# Patient Record
Sex: Female | Born: 1940 | Race: White | Hispanic: No | Smoking: Former smoker
Health system: Southern US, Community
[De-identification: ages and names within clinical notes are randomized; demographics above are authoritative.]

## PROBLEM LIST (undated history)

## (undated) DIAGNOSIS — J189 Pneumonia, unspecified organism: Secondary | ICD-10-CM

## (undated) DIAGNOSIS — H26499 Other secondary cataract, unspecified eye: Secondary | ICD-10-CM

## (undated) DIAGNOSIS — K219 Gastro-esophageal reflux disease without esophagitis: Secondary | ICD-10-CM

## (undated) DIAGNOSIS — I10 Essential (primary) hypertension: Secondary | ICD-10-CM

## (undated) DIAGNOSIS — M5030 Other cervical disc degeneration, unspecified cervical region: Secondary | ICD-10-CM

## (undated) DIAGNOSIS — Z7982 Long term (current) use of aspirin: Secondary | ICD-10-CM

## (undated) DIAGNOSIS — I779 Disorder of arteries and arterioles, unspecified: Secondary | ICD-10-CM

## (undated) DIAGNOSIS — M5412 Radiculopathy, cervical region: Secondary | ICD-10-CM

## (undated) DIAGNOSIS — E785 Hyperlipidemia, unspecified: Secondary | ICD-10-CM

## (undated) DIAGNOSIS — I6789 Other cerebrovascular disease: Secondary | ICD-10-CM

## (undated) DIAGNOSIS — N6019 Diffuse cystic mastopathy of unspecified breast: Secondary | ICD-10-CM

## (undated) DIAGNOSIS — Z789 Other specified health status: Secondary | ICD-10-CM

## (undated) DIAGNOSIS — M1990 Unspecified osteoarthritis, unspecified site: Secondary | ICD-10-CM

## (undated) DIAGNOSIS — D75839 Thrombocytosis, unspecified: Secondary | ICD-10-CM

## (undated) DIAGNOSIS — E538 Deficiency of other specified B group vitamins: Secondary | ICD-10-CM

## (undated) DIAGNOSIS — F419 Anxiety disorder, unspecified: Secondary | ICD-10-CM

## (undated) DIAGNOSIS — H8110 Benign paroxysmal vertigo, unspecified ear: Secondary | ICD-10-CM

## (undated) DIAGNOSIS — L570 Actinic keratosis: Secondary | ICD-10-CM

## (undated) DIAGNOSIS — K449 Diaphragmatic hernia without obstruction or gangrene: Secondary | ICD-10-CM

## (undated) HISTORY — DX: Actinic keratosis: L57.0

## (undated) HISTORY — PX: ABDOMINAL HYSTERECTOMY: SHX81

## (undated) HISTORY — PX: CORONARY ARTERY BYPASS GRAFT: SHX141

## (undated) HISTORY — PX: BREAST BIOPSY: SHX20

---

## 1997-10-01 ENCOUNTER — Other Ambulatory Visit: Admission: RE | Admit: 1997-10-01 | Discharge: 1997-10-01 | Payer: Self-pay | Admitting: *Deleted

## 1997-10-01 ENCOUNTER — Ambulatory Visit (HOSPITAL_COMMUNITY): Admission: RE | Admit: 1997-10-01 | Discharge: 1997-10-01 | Payer: Self-pay | Admitting: *Deleted

## 1997-10-14 ENCOUNTER — Ambulatory Visit (HOSPITAL_COMMUNITY): Admission: RE | Admit: 1997-10-14 | Discharge: 1997-10-14 | Payer: Self-pay | Admitting: *Deleted

## 1997-11-27 ENCOUNTER — Ambulatory Visit (HOSPITAL_BASED_OUTPATIENT_CLINIC_OR_DEPARTMENT_OTHER): Admission: RE | Admit: 1997-11-27 | Discharge: 1997-11-27 | Payer: Self-pay | Admitting: General Surgery

## 1998-10-26 ENCOUNTER — Other Ambulatory Visit: Admission: RE | Admit: 1998-10-26 | Discharge: 1998-10-26 | Payer: Self-pay | Admitting: *Deleted

## 1998-10-26 ENCOUNTER — Encounter: Payer: Self-pay | Admitting: *Deleted

## 1998-10-26 ENCOUNTER — Ambulatory Visit (HOSPITAL_COMMUNITY): Admission: RE | Admit: 1998-10-26 | Discharge: 1998-10-26 | Payer: Self-pay | Admitting: *Deleted

## 1999-11-02 ENCOUNTER — Ambulatory Visit (HOSPITAL_COMMUNITY): Admission: RE | Admit: 1999-11-02 | Discharge: 1999-11-02 | Payer: Self-pay | Admitting: *Deleted

## 1999-11-02 ENCOUNTER — Other Ambulatory Visit: Admission: RE | Admit: 1999-11-02 | Discharge: 1999-11-02 | Payer: Self-pay | Admitting: *Deleted

## 1999-11-02 ENCOUNTER — Encounter: Payer: Self-pay | Admitting: *Deleted

## 2000-11-05 ENCOUNTER — Other Ambulatory Visit: Admission: RE | Admit: 2000-11-05 | Discharge: 2000-11-05 | Payer: Self-pay | Admitting: *Deleted

## 2000-11-05 ENCOUNTER — Encounter: Payer: Self-pay | Admitting: *Deleted

## 2000-11-05 ENCOUNTER — Ambulatory Visit (HOSPITAL_COMMUNITY): Admission: RE | Admit: 2000-11-05 | Discharge: 2000-11-05 | Payer: Self-pay | Admitting: *Deleted

## 2001-11-26 ENCOUNTER — Encounter: Payer: Self-pay | Admitting: *Deleted

## 2001-11-26 ENCOUNTER — Ambulatory Visit (HOSPITAL_COMMUNITY): Admission: RE | Admit: 2001-11-26 | Discharge: 2001-11-26 | Payer: Self-pay | Admitting: *Deleted

## 2001-11-26 ENCOUNTER — Other Ambulatory Visit: Admission: RE | Admit: 2001-11-26 | Discharge: 2001-11-26 | Payer: Self-pay | Admitting: *Deleted

## 2004-12-13 ENCOUNTER — Ambulatory Visit: Payer: Self-pay | Admitting: Internal Medicine

## 2005-11-28 ENCOUNTER — Other Ambulatory Visit: Payer: Self-pay

## 2005-11-28 ENCOUNTER — Emergency Department: Payer: Self-pay | Admitting: Emergency Medicine

## 2006-01-24 ENCOUNTER — Ambulatory Visit: Payer: Self-pay | Admitting: Internal Medicine

## 2006-12-19 ENCOUNTER — Ambulatory Visit: Payer: Self-pay | Admitting: Internal Medicine

## 2007-01-15 ENCOUNTER — Other Ambulatory Visit: Payer: Self-pay

## 2007-01-15 ENCOUNTER — Ambulatory Visit: Payer: Self-pay | Admitting: General Surgery

## 2007-01-18 ENCOUNTER — Ambulatory Visit: Payer: Self-pay | Admitting: General Surgery

## 2007-02-06 ENCOUNTER — Ambulatory Visit: Payer: Self-pay | Admitting: Internal Medicine

## 2007-06-21 ENCOUNTER — Ambulatory Visit: Payer: Self-pay | Admitting: Internal Medicine

## 2007-06-21 DIAGNOSIS — I2510 Atherosclerotic heart disease of native coronary artery without angina pectoris: Secondary | ICD-10-CM

## 2007-06-21 HISTORY — PX: LEFT HEART CATH AND CORONARY ANGIOGRAPHY: CATH118249

## 2007-06-21 HISTORY — DX: Atherosclerotic heart disease of native coronary artery without angina pectoris: I25.10

## 2007-07-10 DIAGNOSIS — Z951 Presence of aortocoronary bypass graft: Secondary | ICD-10-CM

## 2007-07-10 HISTORY — DX: Presence of aortocoronary bypass graft: Z95.1

## 2007-07-10 HISTORY — PX: CORONARY ARTERY BYPASS GRAFT: SHX141

## 2007-08-21 ENCOUNTER — Encounter: Payer: Self-pay | Admitting: Internal Medicine

## 2007-09-07 ENCOUNTER — Encounter: Payer: Self-pay | Admitting: Internal Medicine

## 2007-10-08 ENCOUNTER — Encounter: Payer: Self-pay | Admitting: Internal Medicine

## 2007-11-07 ENCOUNTER — Encounter: Payer: Self-pay | Admitting: Internal Medicine

## 2008-02-11 ENCOUNTER — Ambulatory Visit: Payer: Self-pay | Admitting: Internal Medicine

## 2009-02-12 ENCOUNTER — Ambulatory Visit: Payer: Self-pay | Admitting: Internal Medicine

## 2010-01-05 ENCOUNTER — Ambulatory Visit: Payer: Self-pay | Admitting: Otolaryngology

## 2010-02-14 ENCOUNTER — Ambulatory Visit: Payer: Self-pay | Admitting: Internal Medicine

## 2011-02-16 ENCOUNTER — Ambulatory Visit: Payer: Self-pay | Admitting: Internal Medicine

## 2011-04-05 ENCOUNTER — Ambulatory Visit: Payer: Self-pay | Admitting: Internal Medicine

## 2012-02-19 ENCOUNTER — Ambulatory Visit: Payer: Self-pay | Admitting: Internal Medicine

## 2013-02-19 ENCOUNTER — Ambulatory Visit: Payer: Self-pay | Admitting: Internal Medicine

## 2013-02-24 ENCOUNTER — Ambulatory Visit: Payer: Self-pay | Admitting: Internal Medicine

## 2013-07-24 ENCOUNTER — Ambulatory Visit: Payer: Self-pay | Admitting: Internal Medicine

## 2014-03-02 ENCOUNTER — Ambulatory Visit: Payer: Self-pay | Admitting: Internal Medicine

## 2014-10-19 DIAGNOSIS — Z85828 Personal history of other malignant neoplasm of skin: Secondary | ICD-10-CM

## 2014-10-19 HISTORY — DX: Personal history of other malignant neoplasm of skin: Z85.828

## 2015-02-16 ENCOUNTER — Other Ambulatory Visit: Payer: Self-pay | Admitting: Internal Medicine

## 2015-02-16 DIAGNOSIS — Z1231 Encounter for screening mammogram for malignant neoplasm of breast: Secondary | ICD-10-CM

## 2015-03-05 ENCOUNTER — Other Ambulatory Visit: Payer: Self-pay | Admitting: Internal Medicine

## 2015-03-05 ENCOUNTER — Ambulatory Visit
Admission: RE | Admit: 2015-03-05 | Discharge: 2015-03-05 | Disposition: A | Discharge status: Home / Self Care | Payer: Medicare Other | Source: Ambulatory Visit | Attending: Internal Medicine | Admitting: Internal Medicine

## 2015-03-05 DIAGNOSIS — Z1231 Encounter for screening mammogram for malignant neoplasm of breast: Secondary | ICD-10-CM | POA: Insufficient documentation

## 2015-08-18 ENCOUNTER — Other Ambulatory Visit: Payer: Self-pay | Admitting: Internal Medicine

## 2015-08-18 DIAGNOSIS — R1310 Dysphagia, unspecified: Secondary | ICD-10-CM

## 2015-08-30 ENCOUNTER — Ambulatory Visit
Admission: RE | Admit: 2015-08-30 | Discharge: 2015-08-30 | Disposition: A | Discharge status: Home / Self Care | Payer: Medicare Other | Source: Ambulatory Visit | Attending: Internal Medicine | Admitting: Internal Medicine

## 2015-08-30 DIAGNOSIS — K219 Gastro-esophageal reflux disease without esophagitis: Secondary | ICD-10-CM | POA: Diagnosis not present

## 2015-08-30 DIAGNOSIS — R1310 Dysphagia, unspecified: Secondary | ICD-10-CM | POA: Diagnosis not present

## 2016-02-17 ENCOUNTER — Other Ambulatory Visit: Payer: Self-pay | Admitting: Internal Medicine

## 2016-02-17 DIAGNOSIS — Z1231 Encounter for screening mammogram for malignant neoplasm of breast: Secondary | ICD-10-CM

## 2016-03-17 ENCOUNTER — Ambulatory Visit
Admission: RE | Admit: 2016-03-17 | Discharge: 2016-03-17 | Disposition: A | Discharge status: Home / Self Care | Payer: Medicare Other | Source: Ambulatory Visit | Attending: Internal Medicine | Admitting: Internal Medicine

## 2016-03-17 DIAGNOSIS — Z1231 Encounter for screening mammogram for malignant neoplasm of breast: Secondary | ICD-10-CM | POA: Diagnosis not present

## 2016-12-27 ENCOUNTER — Encounter: Payer: Self-pay | Admitting: Emergency Medicine

## 2016-12-27 ENCOUNTER — Emergency Department: Payer: Medicare Other

## 2016-12-27 ENCOUNTER — Emergency Department
Admission: EM | Admit: 2016-12-27 | Discharge: 2016-12-27 | Disposition: A | Discharge status: Home / Self Care | Payer: Medicare Other | Attending: Emergency Medicine | Admitting: Emergency Medicine

## 2016-12-27 DIAGNOSIS — Z951 Presence of aortocoronary bypass graft: Secondary | ICD-10-CM | POA: Diagnosis not present

## 2016-12-27 DIAGNOSIS — J705 Respiratory conditions due to smoke inhalation: Secondary | ICD-10-CM

## 2016-12-27 DIAGNOSIS — T59811A Toxic effect of smoke, accidental (unintentional), initial encounter: Secondary | ICD-10-CM | POA: Insufficient documentation

## 2016-12-27 DIAGNOSIS — Z87891 Personal history of nicotine dependence: Secondary | ICD-10-CM | POA: Diagnosis not present

## 2016-12-27 DIAGNOSIS — R0602 Shortness of breath: Secondary | ICD-10-CM | POA: Diagnosis present

## 2016-12-27 HISTORY — DX: Hyperlipidemia, unspecified: E78.5

## 2016-12-27 MED ORDER — IPRATROPIUM-ALBUTEROL 0.5-2.5 (3) MG/3ML IN SOLN
3.0000 mL | Freq: Once | RESPIRATORY_TRACT | Status: AC
  Administered 2016-12-27: 3 mL via RESPIRATORY_TRACT
  Filled 2016-12-27: qty 3

## 2016-12-27 MED ORDER — DEXAMETHASONE SODIUM PHOSPHATE 10 MG/ML IJ SOLN
10.0000 mg | Freq: Once | INTRAMUSCULAR | Status: AC
  Administered 2016-12-27: 10 mg via INTRAVENOUS
  Filled 2016-12-27: qty 1

## 2016-12-27 MED ORDER — ALBUTEROL SULFATE HFA 108 (90 BASE) MCG/ACT IN AERS: 2.0000 | INHALATION_SPRAY | Freq: Four times a day (QID) | RESPIRATORY_TRACT | 0 refills | Status: DC | PRN
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
## 2016-12-27 NOTE — ED Notes (Signed)
Pt states she is breathing easier after completing a breathing treatment. No distress noted. Family at bedside.

## 2016-12-27 NOTE — ED Notes (Signed)
Pt returned from xray via stretcher.

## 2016-12-27 NOTE — Discharge Instructions (Signed)
Please use your inhaler as needed for cough and shortness of breath and follow-up with your primary care physician this coming Monday for recheck.  Return to the emergency department sooner for any concerns whatsoever.  It was a pleasure to take care of you today, and thank you for coming to our emergency department.  If you have any questions or concerns before leaving please ask the nurse to grab me and I'm more than happy to go through your aftercare instructions again.  If you were prescribed any opioid pain medication today such as Norco, Vicodin, Percocet, morphine, hydrocodone, or oxycodone please make sure you do not drive when you are taking this medication as it can alter your ability to drive safely.  If you have any concerns once you are home that you are not improving or are in fact getting worse before you can make it to your follow-up appointment, please do not hesitate to call 911 and come back for further evaluation.  Darel Hong, MD  Results for orders placed or performed during the hospital encounter of 12/27/16  Blood gas, venous  Result Value Ref Range   pH, Ven 7.38 7.250 - 7.430   pCO2, Ven 61 (H) 44.0 - 60.0 mmHg   pO2, Ven PENDING 32.0 - 45.0 mmHg   Bicarbonate 36.1 (H) 20.0 - 28.0 mmol/L   Acid-Base Excess 8.4 (H) 0.0 - 2.0 mmol/L   O2 Saturation PENDING %   Patient temperature 37.0    Collection site VENOUS    Sample type VENOUS    Dg Chest 2 View  Result Date: 12/27/2016 CLINICAL DATA:  Shortness of breath.  Ex-smoker EXAM: CHEST  2 VIEW COMPARISON:  11/28/2005 FINDINGS: Hyperinflation. Prior median sternotomy. Midline trachea. Normal heart size. No pleural effusion or pneumothorax. Moderate lower lobe predominant interstitial thickening. No lobar consolidation. IMPRESSION: Hyperinflation and interstitial thickening, consistent with the clinical history of prior smoking. No acute superimposed process. Electronically Signed   By: Abigail Miyamoto M.D.   On:  12/27/2016 19:18

## 2016-12-27 NOTE — ED Notes (Signed)
Pt taken to xray via stretcher  
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
## 2016-12-30 LAB — BLOOD GAS, VENOUS
Acid-Base Excess: 8.4 mmol/L — ABNORMAL HIGH (ref 0.0–2.0)
Bicarbonate: 36.1 mmol/L — ABNORMAL HIGH (ref 20.0–28.0)
PCO2 VEN: 61 mmHg — AB (ref 44.0–60.0)
PH VEN: 7.38 (ref 7.250–7.430)
Patient temperature: 37

## 2017-02-21 ENCOUNTER — Other Ambulatory Visit: Payer: Self-pay | Admitting: Internal Medicine

## 2017-02-21 DIAGNOSIS — Z1231 Encounter for screening mammogram for malignant neoplasm of breast: Secondary | ICD-10-CM

## 2017-04-17 ENCOUNTER — Ambulatory Visit
Admission: RE | Admit: 2017-04-17 | Discharge: 2017-04-17 | Disposition: A | Discharge status: Home / Self Care | Payer: Medicare Other | Source: Ambulatory Visit | Attending: Internal Medicine | Admitting: Internal Medicine

## 2017-04-17 DIAGNOSIS — Z1231 Encounter for screening mammogram for malignant neoplasm of breast: Secondary | ICD-10-CM | POA: Diagnosis present

## 2017-08-20 ENCOUNTER — Other Ambulatory Visit: Payer: Self-pay | Admitting: Internal Medicine

## 2017-08-20 DIAGNOSIS — M5442 Lumbago with sciatica, left side: Secondary | ICD-10-CM

## 2017-09-03 ENCOUNTER — Ambulatory Visit
Admission: RE | Admit: 2017-09-03 | Discharge: 2017-09-03 | Disposition: A | Discharge status: Home / Self Care | Payer: Medicare Other | Source: Ambulatory Visit | Attending: Internal Medicine | Admitting: Internal Medicine

## 2017-09-03 DIAGNOSIS — M5136 Other intervertebral disc degeneration, lumbar region: Secondary | ICD-10-CM | POA: Diagnosis not present

## 2017-09-03 DIAGNOSIS — M48061 Spinal stenosis, lumbar region without neurogenic claudication: Secondary | ICD-10-CM | POA: Insufficient documentation

## 2017-09-03 DIAGNOSIS — M5442 Lumbago with sciatica, left side: Secondary | ICD-10-CM

## 2017-10-04 ENCOUNTER — Ambulatory Visit: Payer: Medicare Other | Attending: Family Medicine

## 2017-10-04 VITALS — BP 154/80 | HR 80

## 2017-10-04 DIAGNOSIS — G8929 Other chronic pain: Secondary | ICD-10-CM | POA: Diagnosis present

## 2017-10-04 DIAGNOSIS — M5442 Lumbago with sciatica, left side: Secondary | ICD-10-CM | POA: Insufficient documentation

## 2017-10-04 NOTE — Therapy (Signed)
[place] MAIN [facility] SERVICES [address] [place], [place], [address] Phone: [phone]   Fax:  [phone]  Physical Therapy Evaluation  Patient Details  Name: Caitlin Roy MRN: [id number] Date of Birth: 03-21-1940 Referring Provider: Loree Fee Meeler   Encounter Date: 10/04/2017  PT End of Session - 10/05/17 1255    Visit Number  1    Number of Visits  13    Date for PT Re-Evaluation  11/15/17    Authorization Type  Progress note: 1/10    Authorization Time Period  Last goals: 10/04/17    PT Start Time  1300    PT Stop Time  1405    PT Time Calculation (min)  65 min    Activity Tolerance  Patient tolerated treatment well    Behavior During Therapy  [facility] for tasks assessed/performed       Past Medical History:  Diagnosis Date  . Hyperlipidemia     Past Surgical History:  Procedure Laterality Date  . ABDOMINAL HYSTERECTOMY    . BREAST BIOPSY Bilateral 10+ years ago   Negative  . CORONARY ARTERY BYPASS GRAFT      Vitals:   10/04/17 1308  BP: (!) 154/80  Pulse: 80  SpO2: 94%     Subjective Assessment - 10/05/17 1247    Subjective  Low back pain    Pertinent History  Pt was referred for acute on chronic left low back pain with radiation to left buttock and down the LLE to the level of the calf. Pt reports that her leg felt "tight" like she couldn't bend it and she was having numbness in her toes. She denies any trauma. Worst pain is 5/10, best is 0/10 and current pain is 3/10. Pain worsens as the day goes on. She plans to follow-up with her MD in 3 months. She was treated with 2 prednisone tapers and the second bout improved her symptoms in the lower left leg. She states that she still has some "crawling feelings" in her L lateral calf.  Her pain originally began approximately 2 years ago (2017) without injury. She was evaluated by neurosurgery on 09/20/2017 at which time conservative management was recommended.     Limitations   Standing;Walking;Sitting    Diagnostic tests  MRI lumbar spine without contrast from [facility]: L5-S1 there is severe bilateral facet arthropathy with shallow disc bulge. Severe left-sided foraminal stenosis moderate right-sided foraminal stenosis. L4-5 severe bilateral facet arthropathy with shallow disc bulge. Borderline central stenosis. Moderate left and mild right foraminal stenosis. L3-4 shallow disc bulge with mild bilateral facet arthropathy. No central or foraminal stenosis. L2-3 left sided disc bulge without central or foraminal stenosis. L1-2 shallow disc bulge without central or foraminal stenosis.    Patient Stated Goals  Decrease her back pain    Currently in Pain?  Yes    Pain Score  3     Pain Location  Back    Pain Orientation  Left;Lower    Pain Descriptors / Indicators  Aching    Pain Type  Chronic pain    Pain Onset  More than a month ago    Pain Frequency  Intermittent    Aggravating Factors   Extended standing, extended walking, extended sitting especially on a hard chair    Pain Relieving Factors  Prednisone taper, Tylenol, Aleve (however causes GI upset), lay down on her side (less pain when laying on left side)    Multiple Pain Sites  No  Rehab Potential  Fair    PT Frequency  2x / week    PT Duration  6 weeks    PT Treatment/Interventions  Cryotherapy;Aquatic Therapy;Electrical Stimulation;Canalith Repostioning;Iontophoresis 4mg /ml Dexamethasone;Moist Heat;Traction;Ultrasound;DME Instruction;Gait training;Stair training;Functional mobility training;Therapeutic activities;Therapeutic exercise;Balance training;Neuromuscular re-education;Patient/family education;Manual techniques;Passive range of motion;Dry needling;Vestibular    PT Next Visit Plan  Review and progress HEP to include strengthening as appropriate,  strengthening and manual techniques    PT Home Exercise Plan  L HS stretch, L piriformis stretch    Consulted and Agree with Plan of Care  Patient       Patient will benefit from skilled therapeutic intervention in order to improve the following deficits and impairments:  Decreased range of motion, Decreased strength, Pain  Visit Diagnosis: Chronic left-sided low back pain with left-sided sciatica     Problem List There are no active problems to display for this patient.  Phillips Grout PT, DPT, GCS  [name] 10/05/2017, 1:25 PM  [place] [facility] SERVICES [address] [place], [place], [address] Phone: [phone]   Fax:  [phone]  Name: Caitlin Roy MRN: [id number] Date of Birth: 1941-02-04  Rehab Potential  Fair    PT Frequency  2x / week    PT Duration  6 weeks    PT Treatment/Interventions  Cryotherapy;Aquatic Therapy;Electrical Stimulation;Canalith Repostioning;Iontophoresis 4mg /ml Dexamethasone;Moist Heat;Traction;Ultrasound;DME Instruction;Gait training;Stair training;Functional mobility training;Therapeutic activities;Therapeutic exercise;Balance training;Neuromuscular re-education;Patient/family education;Manual techniques;Passive range of motion;Dry needling;Vestibular    PT Next Visit Plan  Review and progress HEP to include strengthening as appropriate,  strengthening and manual techniques    PT Home Exercise Plan  L HS stretch, L piriformis stretch    Consulted and Agree with Plan of Care  Patient       Patient will benefit from skilled therapeutic intervention in order to improve the following deficits and impairments:  Decreased range of motion, Decreased strength, Pain  Visit Diagnosis: Chronic left-sided low back pain with left-sided sciatica     Problem List There are no active problems to display for this patient.  Phillips Grout PT, DPT, GCS  [name] 10/05/2017, 1:25 PM  [place] [facility] SERVICES [address] [place], [place], [address] Phone: [phone]   Fax:  [phone]  Name: Caitlin Roy MRN: [id number] Date of Birth: 1941-02-04  [place] MA[facility] SERVICES [address] [place], [place], [address] Phone: [phone]   Fax:  [phone]  Physical Therapy Evaluation  Patient Details  Name: Caitlin Roy MRN: [id number] Date of Birth: 03-21-1940 Referring Provider: Loree Fee Meeler   Encounter Date: 10/04/2017  PT End of Session - 10/05/17 1255    Visit Number  1    Number of Visits  13    Date for PT Re-Evaluation  11/15/17    Authorization Type  Progress note: 1/10    Authorization Time Period  Last goals: 10/04/17    PT Start Time  1300    PT Stop Time  1405    PT Time Calculation (min)  65 min    Activity Tolerance  Patient tolerated treatment well    Behavior During Therapy  [facility] for tasks assessed/performed       Past Medical History:  Diagnosis Date  . Hyperlipidemia     Past Surgical History:  Procedure Laterality Date  . ABDOMINAL HYSTERECTOMY    . BREAST BIOPSY Bilateral 10+ years ago   Negative  . CORONARY ARTERY BYPASS GRAFT      Vitals:   10/04/17 1308  BP: (!) 154/80  Pulse: 80  SpO2: 94%     Subjective Assessment - 10/05/17 1247    Subjective  Low back pain    Pertinent History  Pt was referred for acute on chronic left low back pain with radiation to left buttock and down the LLE to the level of the calf. Pt reports that her leg felt "tight" like she couldn't bend it and she was having numbness in her toes. She denies any trauma. Worst pain is 5/10, best is 0/10 and current pain is 3/10. Pain worsens as the day goes on. She plans to follow-up with her MD in 3 months. She was treated with 2 prednisone tapers and the second bout improved her symptoms in the lower left leg. She states that she still has some "crawling feelings" in her L lateral calf.  Her pain originally began approximately 2 years ago (2017) without injury. She was evaluated by neurosurgery on 09/20/2017 at which time conservative management was recommended.     Limitations   Standing;Walking;Sitting    Diagnostic tests  MRI lumbar spine without contrast from [facility]: L5-S1 there is severe bilateral facet arthropathy with shallow disc bulge. Severe left-sided foraminal stenosis moderate right-sided foraminal stenosis. L4-5 severe bilateral facet arthropathy with shallow disc bulge. Borderline central stenosis. Moderate left and mild right foraminal stenosis. L3-4 shallow disc bulge with mild bilateral facet arthropathy. No central or foraminal stenosis. L2-3 left sided disc bulge without central or foraminal stenosis. L1-2 shallow disc bulge without central or foraminal stenosis.    Patient Stated Goals  Decrease her back pain    Currently in Pain?  Yes    Pain Score  3     Pain Location  Back    Pain Orientation  Left;Lower    Pain Descriptors / Indicators  Aching    Pain Type  Chronic pain    Pain Onset  More than a month ago    Pain Frequency  Intermittent    Aggravating Factors   Extended standing, extended walking, extended sitting especially on a hard chair    Pain Relieving Factors  Prednisone taper, Tylenol, Aleve (however causes GI upset), lay down on her side (less pain when laying on left side)    Multiple Pain Sites  No

## 2017-10-09 ENCOUNTER — Ambulatory Visit: Payer: Medicare Other | Attending: Family Medicine | Admitting: Physical Therapy

## 2017-10-09 DIAGNOSIS — G8929 Other chronic pain: Secondary | ICD-10-CM | POA: Diagnosis present

## 2017-10-09 DIAGNOSIS — M5442 Lumbago with sciatica, left side: Secondary | ICD-10-CM | POA: Insufficient documentation

## 2017-10-09 NOTE — Therapy (Signed)
[place] MAIN [facility] SERVICES [place], [place], [address] Phone: [phone]   Fax:  [phone]  Physical Therapy Treatment  Patient Details  Name: Caitlin Roy MRN: [id number] Date of Birth: 12-23-1940 Referring Provider: Loree Fee Meeler   Encounter Date: 10/09/2017  PT End of Session - 10/09/17 1414    Visit Number  2    Number of Visits  13    Date for PT Re-Evaluation  11/15/17    Authorization Type  Progress note: 1/10    Authorization Time Period  Last goals: 10/04/17       Past Medical History:  Diagnosis Date  . Hyperlipidemia     Past Surgical History:  Procedure Laterality Date  . ABDOMINAL HYSTERECTOMY    . BREAST BIOPSY Bilateral 10+ years ago   Negative  . CORONARY ARTERY BYPASS GRAFT      There were no vitals filed for this visit.  Subjective Assessment - 10/09/17 1354    Subjective  Pt reporting she has been trying out her HEP consistently, no complaints, she feels they are crystal clear, but does endorse some dislike of the neural flossing activity.     Pertinent History  Pt was referred for acute on chronic left low back pain with radiation to left buttock and down the LLE to the level of the calf. Pt reports that her leg felt "tight" like she couldn't bend it and she was having numbness in her toes. She denies any trauma. Worst pain is 5/10, best is 0/10 and current pain is 3/10. Pain worsens as the day goes on. She plans to follow-up with her MD in 3 months. She was treated with 2 prednisone tapers and the second bout improved her symptoms in the lower left leg. She states that she still has some "crawling feelings" in her L lateral calf.  Her pain originally began approximately 2 years ago (2017) without injury. She was evaluated by neurosurgery on 09/20/2017 at which time conservative management was recommended.     Limitations  Standing;Walking;Sitting    Currently in Pain?  Yes    Pain Score  7     Pain  Location  --   Left PSIS/superior SIJ.   Pain Orientation  Left       Treatment this session:  -Myofascial release to posterio hip 5x30sec at various portions of glute max and glute med, does not abate symptoms.  -Left hip extension mobilization with movement in prone: 3x30sec -Left hip external rotation + Extension mobilization with movement in slight abduction (prone): 3x30sec -Left hip External rotation stretch P/ROM in prone 2x30sec -Left hip long axis distraction supine: 1x2 minutes -glute max bridge 2x10 (end range knee flexion c wide stance/knees) -DKTC stretch 6x15 seconds (mild reduction of symptoms in flexion, wide to avoid hip impingement)  -Reverse Curlup, 2x10 **heavy verbal and tactile cues to achieve correct posture and form for exercises.          PT Short Term Goals - 10/05/17 1308      PT SHORT TERM GOAL #1   Title  Pt will be independent with HEP in order to improve strength and decrease pain in order to improve pain-free function at home and work.     Time  3    Period  Weeks    Status  New    Target Date  10/25/17        PT Long Term Goals - 10/05/17 1308  PT LONG TERM GOAL #1   Title  Pt will decrease worst pain as reported on NPRS by at least 3 points in order to demonstrate clinically significant reduction in pain.    Baseline  10/04/17: 5/10    Time  6    Period  Weeks    Status  New    Target Date  11/15/17      PT LONG TERM GOAL #2   Title  Pt will increase strength of L hip and L knee by at least 1/2 MMT grade in order to demonstrate improvement in strength and function     Baseline  10/04/17: 4/5 hip extension, 4/5 knee flexion    Time  6    Period  Weeks    Status  New    Target Date  11/15/17      PT LONG TERM GOAL #3   Title  Pt will improve L hip IR ROM by at least 5 degrees to decrease lumbar forces when turning and decrease pain    Baseline  10/04/17: 30 degrees    Time  6    Period  Weeks    Status  New    Target Date   11/15/17            Plan - 10/09/17 1414    Clinical Impression Statement  Reviewed goals and HEP. Continued to explore specific activities that modify pain. Pt does endorse some pain primarily at the left SIJ area, easy to palpate superfiscially, and clearly with some superfiscial soft tissue componenets, however provocation testing of the SIJ does not affect pain. Pt does endorse some decreased pain with DKTC stretch positions. Sustained release not decrease pain suggestive of low liklihood of primary symptomatic complaint being myofascial in origin. Sustained lumbar lordosis does not appear to affect symptoms to any acute degree. Mobilization of th ehip joint and deep palpation of th eposterio hip joint aggravate pain as well as refering pain into the anterior proximal leg as well. No noted reproduction/exacerbation of pain with pelvic compression test or sacral thrust.     PT Frequency  2x / week    PT Duration  6 weeks    PT Treatment/Interventions  Cryotherapy;Aquatic Therapy;Electrical Stimulation;Canalith Repostioning;Iontophoresis 4mg /ml Dexamethasone;Moist Heat;Traction;Ultrasound;DME Instruction;Gait training;Stair training;Functional mobility training;Therapeutic activities;Therapeutic exercise;Balance training;Neuromuscular re-education;Patient/family education;Manual techniques;Passive range of motion;Dry needling;Vestibular    PT Next Visit Plan  Progress HEP as approprriate to include strengthening, strengthening; consider additional testing to assess involvement of Left SIJ     PT Home Exercise Plan  L HS stretch, L piriformis stretch    Consulted and Agree with Plan of Care  Patient       Patient will benefit from skilled therapeutic intervention in order to improve the following deficits and impairments:     Visit Diagnosis: Chronic left-sided low back pain with left-sided sciatica     Problem List There are no active problems to display for this patient.  2:38 PM,  10/09/17 Etta Grandchild, PT, DPT Physical Therapist - [facility]  Outpatient Physical Therapy- [place] [phone]    Etta Grandchild 10/09/2017, 2:37 PM  [place] MAIN [facility] SERVICES [address] [place], [place], [address] Phone: [phone]   Fax:  [phone]  Name: Caitlin Roy MRN: [id number] Date of Birth: 04/14/1940

## 2017-10-11 ENCOUNTER — Encounter: Payer: Medicare Other | Admitting: Physical Therapy

## 2017-10-16 ENCOUNTER — Other Ambulatory Visit: Payer: Self-pay

## 2017-10-16 ENCOUNTER — Encounter: Payer: Self-pay | Admitting: Physical Therapy

## 2017-10-16 ENCOUNTER — Ambulatory Visit: Payer: Medicare Other | Admitting: Physical Therapy

## 2017-10-16 DIAGNOSIS — G8929 Other chronic pain: Secondary | ICD-10-CM

## 2017-10-16 DIAGNOSIS — M5442 Lumbago with sciatica, left side: Secondary | ICD-10-CM | POA: Diagnosis not present

## 2017-10-16 NOTE — Therapy (Signed)
[place] MAIN [facility] SERVICES [address] [place], [place], [address] Phone: [phone]   Fax:  [phone]  Physical Therapy Treatment  Patient Details  Name: Caitlin Roy MRN: [id number] Date of Birth: March 29, 1940 Referring Provider: Loree Fee Meeler   Encounter Date: 10/16/2017  PT End of Session - 10/16/17 1403    Visit Number  3    Number of Visits  13    Date for PT Re-Evaluation  11/15/17    Authorization Type  Progress note: 3/10    Authorization Time Period  Last goals: 10/04/17    PT Start Time  1345    PT Stop Time  1430    PT Time Calculation (min)  45 min    Activity Tolerance  Patient tolerated treatment well;No increased pain    Behavior During Therapy  WFL for tasks assessed/performed       Past Medical History:  Diagnosis Date  . Hyperlipidemia     Past Surgical History:  Procedure Laterality Date  . ABDOMINAL HYSTERECTOMY    . BREAST BIOPSY Bilateral 10+ years ago   Negative  . CORONARY ARTERY BYPASS GRAFT      There were no vitals filed for this visit.  Subjective Assessment - 10/16/17 1400    Subjective  Patient reports still having low back pain which is along sacrum and to left side; Denies any pain down LLE; Reports adherence to HEP;     Pertinent History  Pt was referred for acute on chronic left low back pain with radiation to left buttock and down the LLE to the level of the calf. Pt reports that her leg felt "tight" like she couldn't bend it and she was having numbness in her toes. She denies any trauma. Worst pain is 5/10, best is 0/10 and current pain is 3/10. Pain worsens as the day goes on. She plans to follow-up with her MD in 3 months. She was treated with 2 prednisone tapers and the second bout improved her symptoms in the lower left leg. She states that she still has some "crawling feelings" in her L lateral calf.  Her pain originally began approximately 2 years ago (2017) without injury. She was  evaluated by neurosurgery on 09/20/2017 at which time conservative management was recommended.     Limitations  Standing;Walking;Sitting    Currently in Pain?  Yes    Pain Score  5     Pain Location  Back    Pain Orientation  Left;Lower    Pain Descriptors / Indicators  Aching;Sore    Pain Type  Chronic pain    Pain Onset  More than a month ago    Pain Frequency  Intermittent    Aggravating Factors   extended standing/sitting    Pain Relieving Factors  medication, rest    Effect of Pain on Daily Activities  decreased standing tolerance;     Multiple Pain Sites  No         TREATMENT: Patient hooklying with moist heat to low back: -pball lumbar trunk rotation x2 min each direction; -pball double knee to chest stretch 5 sec hold x5 reps, patient initially reported increased tightness in leg, but denies any back discomfort; -single knee to chest stretch 20 sec hold x2 reps bilaterally -posterior pelvic tilt 5 sec hold x10 reps with mod VCs for positioning and sequencing to improve core stabilization;  Piriformis stretch regular and modified 20 sec hold x2 reps each LE;  Posterior pelvic rock: -  with alternate march x10 bilaterally; -BLE leg lift hip/knee flexion 90 degrees 5 sec hold x5 reps with mod VCs for sequencing and positioning; Patient required min-moderate verbal/tactile cues for correct exercise technique and to increase core abdominal stabilization with UE/LE movement   Finished with soft tissue massage to left quad/hamstring and gluteal with rolling stick x6 min to facilitate better tissue extensibility and reduce tightness. Patient exhibits increased tightness along left hamstring (medial thigh); She tolerated well reporting no pain at end of session;                      PT Education - 10/16/17 1403    Education Details  exercise form, positioning, HEP reinforced;     Person(s) Educated  Patient    Methods  Explanation;Demonstration;Verbal cues     Comprehension  Returned demonstration;Verbal cues required;Verbalized understanding;Need further instruction       PT Short Term Goals - 10/05/17 1308      PT SHORT TERM GOAL #1   Title  Pt will be independent with HEP in order to improve strength and decrease pain in order to improve pain-free function at home and work.     Time  3    Period  Weeks    Status  New    Target Date  10/25/17        PT Long Term Goals - 10/05/17 1308      PT LONG TERM GOAL #1   Title  Pt will decrease worst pain as reported on NPRS by at least 3 points in order to demonstrate clinically significant reduction in pain.    Baseline  10/04/17: 5/10    Time  6    Period  Weeks    Status  New    Target Date  11/15/17      PT LONG TERM GOAL #2   Title  Pt will increase strength of L hip and L knee by at least 1/2 MMT grade in order to demonstrate improvement in strength and function     Baseline  10/04/17: 4/5 hip extension, 4/5 knee flexion    Time  6    Period  Weeks    Status  New    Target Date  11/15/17      PT LONG TERM GOAL #3   Title  Pt will improve L hip IR ROM by at least 5 degrees to decrease lumbar forces when turning and decrease pain    Baseline  10/04/17: 30 degrees    Time  6    Period  Weeks    Status  New    Target Date  11/15/17            Plan - 10/16/17 1609    Clinical Impression Statement  Patient instructed in lumbar flexion exercise to facilitate better lumbar stretch and core stability. She required min-mod VCs for correct exercise technique to isolate core abdominal activation. Advanced HEP- see patient instructions. Patient tolerated session well reporting less pain at end of session. She would benefit from additional skilled PT Intervention to improve strength, balance and gait safety;     PT Frequency  2x / week    PT Duration  6 weeks    PT Treatment/Interventions  Cryotherapy;Aquatic Therapy;Electrical Stimulation;Canalith Repostioning;Iontophoresis 4mg /ml  Dexamethasone;Moist Heat;Traction;Ultrasound;DME Instruction;Gait training;Stair training;Functional mobility training;Therapeutic activities;Therapeutic exercise;Balance training;Neuromuscular re-education;Patient/family education;Manual techniques;Passive range of motion;Dry needling;Vestibular    PT Next Visit Plan  Progress HEP as approprriate to include strengthening, strengthening; consider  additional testing to assess involvement of Left SIJ     PT Home Exercise Plan  L HS stretch, L piriformis stretch    Consulted and Agree with Plan of Care  Patient       Patient will benefit from skilled therapeutic intervention in order to improve the following deficits and impairments:     Visit Diagnosis: Chronic left-sided low back pain with left-sided sciatica     Problem List There are no active problems to display for this patient.   Trotter,Margaret PT, DPT 10/16/2017, 4:11 PM  [place] MAIN [facility] SERVICES [address] [place], [place], [address] Phone: [phone]   Fax:  [phone]  Name: Caitlin Roy MRN: [id number] Date of Birth: September 27, 1940

## 2017-10-16 NOTE — Patient Instructions (Signed)
Pelvic Tilt  Lying on back with knees bent, Flatten back by tightening stomach muscles and rocking hips back Hold for 5 sec, Repeat __10__ times per set. Do __1__ sets per session. Do __2__ sessions per day.  http://orth.exer.us/134    Copyright  VHI. All rights reserved. Knee to Chest (Flexion)   Pull knee toward chest. Feel stretch in lower back or buttock area. Breathing deeply, Hold __15__ seconds. Repeat with other knee. Repeat _2-3___ times. Do _2-3___ sessions per day.  http://gt2.exer.us/225   Copyright  VHI. All rights reserved.   Lower Trunk Rotation Stretch  Lying on back with knees bent, Keeping back flat and feet together, rotate knees side to side slowly and in pain free range of motion.  Hold _2___ seconds. Repeat for 1-2 minutes. Do __1__ sets per session. Do __2-3__ sessions per day.  http://orth.exer.us/122    

## 2017-10-18 ENCOUNTER — Ambulatory Visit: Payer: Medicare Other | Admitting: Physical Therapy

## 2017-10-18 DIAGNOSIS — G8929 Other chronic pain: Secondary | ICD-10-CM

## 2017-10-18 DIAGNOSIS — M5442 Lumbago with sciatica, left side: Principal | ICD-10-CM

## 2017-10-18 NOTE — Therapy (Signed)
[place] MAIN [facility] SERVICES [address] [place], [place], [address] Phone: [phone]   Fax:  [phone]  Physical Therapy Treatment  Patient Details  Name: Caitlin Roy MRN: [id number] Date of Birth: 08/17/1940 Referring Provider: Loree Fee Meeler   Encounter Date: 10/18/2017  PT End of Session - 10/18/17 1351    Visit Number  4    Number of Visits  13    Date for PT Re-Evaluation  11/15/17    Authorization Type  Progress note: 3/10    Authorization Time Period  Last goals: 10/04/17    PT Start Time  1346    PT Stop Time  1426    PT Time Calculation (min)  40 min    Activity Tolerance  Patient tolerated treatment well;No increased pain    Behavior During Therapy  WFL for tasks assessed/performed       Past Medical History:  Diagnosis Date  . Hyperlipidemia     Past Surgical History:  Procedure Laterality Date  . ABDOMINAL HYSTERECTOMY    . BREAST BIOPSY Bilateral 10+ years ago   Negative  . CORONARY ARTERY BYPASS GRAFT      There were no vitals filed for this visit.  Subjective Assessment - 10/18/17 1350    Subjective  Pt reports feeling pretty good today, but reports that she has been taking it easy overall.     Pertinent History  Pt was referred for acute on chronic left low back pain with radiation to left buttock and down the LLE to the level of the calf. Pt reports that her leg felt "tight" like she couldn't bend it and she was having numbness in her toes. She denies any trauma. Worst pain is 5/10, best is 0/10 and current pain is 3/10. Pain worsens as the day goes on. She plans to follow-up with her MD in 3 months. She was treated with 2 prednisone tapers and the second bout improved her symptoms in the lower left leg. She states that she still has some "crawling feelings" in her L lateral calf.  Her pain originally began approximately 2 years ago (2017) without injury. She was evaluated by neurosurgery on 09/20/2017 at which  time conservative management was recommended.     Currently in Pain?  Yes    Pain Score  2     Pain Location  --   left posterior pelvis        TREATMENT: -double knee to chest stretch: 2x30sec  -Reverse curl-up (BLE bent knee raise) 2x15 -Hooklying FABER Stretch (ankle over opposite knee) 2x30sec bilat -Hooklying band clam, bolster between feet: 2x15 greenTB, 1x10 c blue TB   -hooklying bridge (modified to target glute max) 3x10 -Supine hip abduction heel slides: 1x15 c YTB  - seated STS from chair, hands free 2x10  -Lateral side stepping c GreenTB: 2x10M bilat (+) reproduction of symptomatic area, although not painful, just 'sore' -Lateral step up: 1x12 bilat (7-inch step) hands free  Patient required min-moderate verbal/tactile cues for correct exercise technique and to increase core abdominal stabilization with UE/LE movement   PT Short Term Goals - 10/05/17 1308      PT SHORT TERM GOAL #1   Title  Pt will be independent with HEP in order to improve strength and decrease pain in order to improve pain-free function at home and work.     Time  3    Period  Weeks    Status  New    Target  Date  10/25/17        PT Long Term Goals - 10/05/17 1308      PT LONG TERM GOAL #1   Title  Pt will decrease worst pain as reported on NPRS by at least 3 points in order to demonstrate clinically significant reduction in pain.    Baseline  10/04/17: 5/10    Time  6    Period  Weeks    Status  New    Target Date  11/15/17      PT LONG TERM GOAL #2   Title  Pt will increase strength of L hip and L knee by at least 1/2 MMT grade in order to demonstrate improvement in strength and function     Baseline  10/04/17: 4/5 hip extension, 4/5 knee flexion    Time  6    Period  Weeks    Status  New    Target Date  11/15/17      PT LONG TERM GOAL #3   Title  Pt will improve L hip IR ROM by at least 5 degrees to decrease lumbar forces when turning and decrease pain    Baseline  10/04/17: 30  degrees    Time  6    Period  Weeks    Status  New    Target Date  11/15/17            Plan - 10/18/17 1358    Clinical Impression Statement  Continued with current program targeting hip mobility, hip strength, core strenght, core motor control training. Pt tolerating well overall. Minimal pain upon arrival and no noted exacerbation in session. Hip extension continues to remain the most limited in strength, ut is tolerated well. Frequent VC required for form and technique but patient responds quickly to these cues.  Pt does verbalize that muscle fatigue activation is quite similar to CC area of pain at the Right SIJ area.    Rehab Potential  Fair    PT Frequency  2x / week    PT Duration  6 weeks    PT Treatment/Interventions  Cryotherapy;Aquatic Therapy;Electrical Stimulation;Canalith Repostioning;Iontophoresis 4mg /ml Dexamethasone;Moist Heat;Traction;Ultrasound;DME Instruction;Gait training;Stair training;Functional mobility training;Therapeutic activities;Therapeutic exercise;Balance training;Neuromuscular re-education;Patient/family education;Manual techniques;Passive range of motion;Dry needling;Vestibular    PT Next Visit Plan  Progress glute med strengthening, move to more standing activity when possible (I.e. SLS, lateral step up). Also consider addresing/assessing hip extension mobility deficits.     PT Home Exercise Plan  L HS stretch, L piriformis stretch    Consulted and Agree with Plan of Care  Patient       Patient will benefit from skilled therapeutic intervention in order to improve the following deficits and impairments:  Decreased range of motion, Decreased strength, Pain  Visit Diagnosis: Chronic left-sided low back pain with left-sided sciatica     Problem List There are no active problems to display for this patient. 2:20 PM, 10/18/17 Etta Grandchild, PT, DPT Physical Therapist - [place] [phone]    Etta Grandchild 10/18/2017, 2:13 PM  [place] MAIN [facility] SERVICES [address] [place], [place], [address] Phone: [phone]   Fax:  [phone]  Name: Caitlin Roy MRN: [id number] Date of Birth: [birth year]

## 2017-10-23 ENCOUNTER — Ambulatory Visit: Payer: Medicare Other | Admitting: Physical Therapy

## 2017-10-23 ENCOUNTER — Encounter: Payer: Self-pay | Admitting: Physical Therapy

## 2017-10-23 DIAGNOSIS — M5442 Lumbago with sciatica, left side: Secondary | ICD-10-CM | POA: Diagnosis not present

## 2017-10-23 DIAGNOSIS — G8929 Other chronic pain: Secondary | ICD-10-CM

## 2017-10-23 NOTE — Therapy (Signed)
[place] MAIN [facility] SERVICES [address] [place], [place], [address] Phone: [phone]   Fax:  [phone]  Physical Therapy Treatment  Patient Details  Name: Caitlin Roy MRN: [id number] Date of Birth: April 24, 1940 Referring Provider: Loree Fee Meeler   Encounter Date: 10/23/2017  PT End of Session - 10/23/17 1343    Visit Number  5    Number of Visits  13    Date for PT Re-Evaluation  11/15/17    Authorization Type  Progress note: 4/10    Authorization Time Period  Last goals: 10/04/17    PT Start Time  1345    PT Stop Time  1429    PT Time Calculation (min)  44 min    Activity Tolerance  Patient tolerated treatment well;No increased pain    Behavior During Therapy  WFL for tasks assessed/performed       Past Medical History:  Diagnosis Date  . Hyperlipidemia     Past Surgical History:  Procedure Laterality Date  . ABDOMINAL HYSTERECTOMY    . BREAST BIOPSY Bilateral 10+ years ago   Negative  . CORONARY ARTERY BYPASS GRAFT      There were no vitals filed for this visit.  Subjective Assessment - 10/23/17 1353    Subjective  Pt reports she is having more "good days" compared to before starting therapy but still has some not so good days.  Today she is a not so good day.  She did yardwork yesterday which usually makes her feel "more loose and good".  Pt has been completing her HEP 2x/day 7 days/wk.      Pertinent History  Pt was referred for acute on chronic left low back pain with radiation to left buttock and down the LLE to the level of the calf. Pt reports that her leg felt "tight" like she couldn't bend it and she was having numbness in her toes. She denies any trauma. Worst pain is 5/10, best is 0/10 and current pain is 3/10. Pain worsens as the day goes on. She plans to follow-up with her MD in 3 months. She was treated with 2 prednisone tapers and the second bout improved her symptoms in the lower left leg. She states that she  still has some "crawling feelings" in her L lateral calf.  Her pain originally began approximately 2 years ago (2017) without injury. She was evaluated by neurosurgery on 09/20/2017 at which time conservative management was recommended.     Currently in Pain?  Yes    Pain Score  4     Pain Location  Back    Pain Orientation  Left;Lower    Pain Descriptors / Indicators  Aching    Pain Type  Chronic pain         TREATMENT   Double knee to chest stretch: 10 seconds x10  Reverse curl-up (BLE bent knee raise) 2x15  Lower lumbar rotations with knees bent x10 each direction  Hooklying FABER Stretch (ankle over opposite knee) x1 minute bilat  Hooklying bridge (modified to target glute max) 2x15  Hooklying clam with band: 10 second holds x10  Prone Bil manual quad stretch x45 seconds each LE  Prone Bil quad contract relax, 5 second activation, 10 second stretch  Seated forward flexion trunk stretch x10 seconds x10  Lateral step up: 1x10 bilat (6-inch step) hands free  PT Education - 10/23/17 1343    Education Details  Exercise technique    Person(s) Educated  Patient    Methods  Explanation;Demonstration;Verbal cues    Comprehension  Verbalized understanding;Returned demonstration;Verbal cues required;Need further instruction       PT Short Term Goals - 10/05/17 1308      PT SHORT TERM GOAL #1   Title  Pt will be independent with HEP in order to improve strength and decrease pain in order to improve pain-free function at home and work.     Time  3    Period  Weeks    Status  New    Target Date  10/25/17        PT Long Term Goals - 10/05/17 1308      PT LONG TERM GOAL #1   Title  Pt will decrease worst pain as reported on NPRS by at least 3 points in order to demonstrate clinically significant reduction in pain.    Baseline  10/04/17: 5/10    Time  6    Period  Weeks    Status  New    Target Date  11/15/17      PT LONG TERM GOAL #2    Title  Pt will increase strength of L hip and L knee by at least 1/2 MMT grade in order to demonstrate improvement in strength and function     Baseline  10/04/17: 4/5 hip extension, 4/5 knee flexion    Time  6    Period  Weeks    Status  New    Target Date  11/15/17      PT LONG TERM GOAL #3   Title  Pt will improve L hip IR ROM by at least 5 degrees to decrease lumbar forces when turning and decrease pain    Baseline  10/04/17: 30 degrees    Time  6    Period  Weeks    Status  New    Target Date  11/15/17            Plan - 10/23/17 1355    Clinical Impression Statement  Pt reports decreased pain after completing double knee to chest exercise so emphasis placed on flexion based exercises this session.  Educated pt on the connection between core strength and back pain.  Introduced Bil quad contract relax with LLE much more tight than RLE.  Continued with glute med strengthening.  Pt will benefit from continued skilled PT interventions for decreased pain and improved QOL.     Rehab Potential  Fair    PT Frequency  2x / week    PT Duration  6 weeks    PT Treatment/Interventions  Cryotherapy;Aquatic Therapy;Electrical Stimulation;Canalith Repostioning;Iontophoresis 4mg /ml Dexamethasone;Moist Heat;Traction;Ultrasound;DME Instruction;Gait training;Stair training;Functional mobility training;Therapeutic activities;Therapeutic exercise;Balance training;Neuromuscular re-education;Patient/family education;Manual techniques;Passive range of motion;Dry needling;Vestibular    PT Next Visit Plan  Progress HEP as approprriate to include strengthening, strengthening; consider additional testing to assess involvement of Left SIJ     PT Home Exercise Plan  L HS stretch, L piriformis stretch    Consulted and Agree with Plan of Care  Patient       Patient will benefit from skilled therapeutic intervention in order to improve the following deficits and impairments:  Decreased range of motion,  Decreased strength, Pain  Visit Diagnosis: Chronic left-sided low back pain with left-sided sciatica     Problem List There are no active problems to display for this patient.   Collie Siad  PT, DPT 10/23/2017, 2:28 PM  [place] MAIN [facility] SERVICES [address] [place], [place], [address] Phone: [phone]   Fax:  [phone]  Name: Caitlin Roy MRN: [id number] Date of Birth: Dec 02, 1940

## 2017-10-25 ENCOUNTER — Ambulatory Visit: Payer: Medicare Other | Admitting: Physical Therapy

## 2017-10-25 ENCOUNTER — Encounter: Payer: Self-pay | Admitting: Physical Therapy

## 2017-10-25 DIAGNOSIS — G8929 Other chronic pain: Secondary | ICD-10-CM

## 2017-10-25 DIAGNOSIS — M5442 Lumbago with sciatica, left side: Secondary | ICD-10-CM | POA: Diagnosis not present

## 2017-10-25 NOTE — Therapy (Signed)
[place] MAIN [facility] SERVICES [address] [place], [place], [address] Phone: [phone]   Fax:  [phone]  Physical Therapy Treatment  Patient Details  Name: Caitlin Roy MRN: [id number] Date of Birth: [birth year] Referring Provider: Loree Fee Meeler   Encounter Date: 10/25/2017  PT End of Session - 10/25/17 1347    Visit Number  6    Number of Visits  13    Date for PT Re-Evaluation  11/15/17    Authorization Type  Progress note: 5/10    Authorization Time Period  Last goals: 10/04/17    PT Start Time  1347    PT Stop Time  1428    PT Time Calculation (min)  41 min    Activity Tolerance  Patient tolerated treatment well;No increased pain    Behavior During Therapy  WFL for tasks assessed/performed       Past Medical History:  Diagnosis Date  . Hyperlipidemia     Past Surgical History:  Procedure Laterality Date  . ABDOMINAL HYSTERECTOMY    . BREAST BIOPSY Bilateral 10+ years ago   Negative  . CORONARY ARTERY BYPASS GRAFT      There were no vitals filed for this visit.  Subjective Assessment - 10/25/17 1352    Subjective  Pt reports she was having more pain last night before she went to bed that has carried into today and denies any new activities since last session that may have aggravated her pain.  Pt says, "I guess that's just the way it's going to be".     Pertinent History  Pt was referred for acute on chronic left low back pain with radiation to left buttock and down the LLE to the level of the calf. Pt reports that her leg felt "tight" like she couldn't bend it and she was having numbness in her toes. She denies any trauma. Worst pain is 5/10, best is 0/10 and current pain is 3/10. Pain worsens as the day goes on. She plans to follow-up with her MD in 3 months. She was treated with 2 prednisone tapers and the second bout improved her symptoms in the lower left leg. She states that she still has some "crawling feelings" in her  L lateral calf.  Her pain originally began approximately 2 years ago (2017) without injury. She was evaluated by neurosurgery on 09/20/2017 at which time conservative management was recommended.     Currently in Pain?  Yes    Pain Score  6     Pain Location  Back    Pain Orientation  Left;Lower    Pain Descriptors / Indicators  Aching    Pain Type  Chronic pain    Pain Onset  More than a month ago        TREATMENT  Pt reports 6/10 pain.  Double knee to chest stretch: 10 seconds x10  Lower lumbar rotations with knees bent x15 each direction  Hooklying bridge (modified to target glute max) 2x15  Hooklying isometric clam with band: 10 second holds x10  Posterior tilts in hooklying with verbal and tactile cues for proper technique x20  Posterior tilts in hooklying with 5 second holds x15  Seated forward flexion trunk stretch x10 seconds x10  Pt reports 0/10 pain.  Lateral step up to 6 inch step 2x10 bilat without UE support  SLS 3x30 seconds each LE with intermittent UE support  Lateral lunges 2x10 each direction without UE support  PT Education - 10/25/17 1347    Education Details  Exercise technique    Person(s) Educated  Patient    Methods  Explanation;Demonstration;Verbal cues    Comprehension  Verbalized understanding;Returned demonstration;Verbal cues required;Need further instruction       PT Short Term Goals - 10/05/17 1308      PT SHORT TERM GOAL #1   Title  Pt will be independent with HEP in order to improve strength and decrease pain in order to improve pain-free function at home and work.     Time  3    Period  Weeks    Status  New    Target Date  10/25/17        PT Long Term Goals - 10/05/17 1308      PT LONG TERM GOAL #1   Title  Pt will decrease worst pain as reported on NPRS by at least 3 points in order to demonstrate clinically significant reduction in pain.    Baseline  10/04/17: 5/10    Time  6    Period   Weeks    Status  New    Target Date  11/15/17      PT LONG TERM GOAL #2   Title  Pt will increase strength of L hip and L knee by at least 1/2 MMT grade in order to demonstrate improvement in strength and function     Baseline  10/04/17: 4/5 hip extension, 4/5 knee flexion    Time  6    Period  Weeks    Status  New    Target Date  11/15/17      PT LONG TERM GOAL #3   Title  Pt will improve L hip IR ROM by at least 5 degrees to decrease lumbar forces when turning and decrease pain    Baseline  10/04/17: 30 degrees    Time  6    Period  Weeks    Status  New    Target Date  11/15/17            Plan - 10/25/17 1409    Clinical Impression Statement  Educated pt on the healing pathway and that initially not everyday will feel the same as she heals.  She will naturally have some days where she notices her pain more than others but generally we expect her pain to improve.  Encouraged pt to perform double or single knee to chest exercise when she does have an exacerbation of her pain at home, as this seems to relieve her pain.  Introduced posterior pelvic tilts to activate core. Pt reports 0/10 pain after exercises.  Pt will benefit from continued skilled PT interventions for decreased pain and improved QOL.     Rehab Potential  Fair    PT Frequency  2x / week    PT Duration  6 weeks    PT Treatment/Interventions  Cryotherapy;Aquatic Therapy;Electrical Stimulation;Canalith Repostioning;Iontophoresis 4mg /ml Dexamethasone;Moist Heat;Traction;Ultrasound;DME Instruction;Gait training;Stair training;Functional mobility training;Therapeutic activities;Therapeutic exercise;Balance training;Neuromuscular re-education;Patient/family education;Manual techniques;Passive range of motion;Dry needling;Vestibular    PT Next Visit Plan  Progress HEP as approprriate to include strengthening, strengthening; consider additional testing to assess involvement of Left SIJ     PT Home Exercise Plan  L HS stretch, L  piriformis stretch    Consulted and Agree with Plan of Care  Patient       Patient will benefit from skilled therapeutic intervention in order to improve the following deficits and impairments:  Decreased range of motion,  Decreased strength, Pain  Visit Diagnosis: Chronic left-sided low back pain with left-sided sciatica     Problem List There are no active problems to display for this patient.   Collie Siad PT, DPT 10/25/2017, 2:22 PM  [place] MAIN [facility] SERVICES [address] [place], [place], [address] Phone: [phone]   Fax:  [phone]  Name: Caitlin Roy MRN: [id number] Date of Birth: 1941-01-04

## 2017-10-30 ENCOUNTER — Ambulatory Visit: Payer: Medicare Other | Admitting: Physical Therapy

## 2017-10-30 ENCOUNTER — Encounter: Payer: Self-pay | Admitting: Physical Therapy

## 2017-10-30 DIAGNOSIS — G8929 Other chronic pain: Secondary | ICD-10-CM

## 2017-10-30 DIAGNOSIS — M5442 Lumbago with sciatica, left side: Secondary | ICD-10-CM | POA: Diagnosis not present

## 2017-10-30 NOTE — Therapy (Signed)
[place] MAIN [facility] SERVICES [address] [place], [place], [address] Phone: [phone]   Fax:  [phone]  Physical Therapy Treatment  Patient Details  Name: Caitlin Roy MRN: [id number] Date of Birth: [birth year] Referring Provider: Loree Fee Meeler   Encounter Date: 10/30/2017  PT End of Session - 10/30/17 1347    Visit Number  7    Number of Visits  13    Date for PT Re-Evaluation  11/15/17    Authorization Type  Progress note: 8/10    Authorization Time Period  Last goals: 10/04/17    PT Start Time  1347    PT Stop Time  1428    PT Time Calculation (min)  41 min    Activity Tolerance  Patient tolerated treatment well;No increased pain    Behavior During Therapy  WFL for tasks assessed/performed       Past Medical History:  Diagnosis Date  . Hyperlipidemia     Past Surgical History:  Procedure Laterality Date  . ABDOMINAL HYSTERECTOMY    . BREAST BIOPSY Bilateral 10+ years ago   Negative  . CORONARY ARTERY BYPASS GRAFT      There were no vitals filed for this visit.  Subjective Assessment - 10/30/17 1352    Subjective  Pt reports that she felt good on Thursday after she left her appointment but she started having pain again on Friday.  Since then the pain has been off and on.  Pt has had two episodes of tingling in her R calf region over the past week that lasted ~3 minutes each which is new for her. Red flags negative. Pt is a poor historian and has difficulty giving any amount of specifics about frequency and duration of pain.  Pt has an appointment with her NP on Oct 1st and she might ask about an injection or steroids at this appointment.  Pt's numbness in LLE has moved from her ankle region up to her calf region.  Pt reports she completed her HEP when her back began to bother her and this helped relieve some of her pain.     Pertinent History  Pt was referred for acute on chronic left low back pain with radiation to left  buttock and down the LLE to the level of the calf. Pt reports that her leg felt "tight" like she couldn't bend it and she was having numbness in her toes. She denies any trauma. Worst pain is 5/10, best is 0/10 and current pain is 3/10. Pain worsens as the day goes on. She plans to follow-up with her MD in 3 months. She was treated with 2 prednisone tapers and the second bout improved her symptoms in the lower left leg. She states that she still has some "crawling feelings" in her L lateral calf.  Her pain originally began approximately 2 years ago (2017) without injury. She was evaluated by neurosurgery on 09/20/2017 at which time conservative management was recommended.     Currently in Pain?  Yes    Pain Score  4     Pain Location  Back    Pain Orientation  Left;Lower    Pain Descriptors / Indicators  Aching    Pain Type  Chronic pain    Pain Onset  More than a month ago        TREATMENT   Pt reports 4/10 pain   Double knee to chest stretch: 10 seconds x10   Lower lumbar rotations with knees  bent x15 each direction   Hooklying bridge (modified to target glute max) 2x15   Hooklying isometric clam with band: 10 second holds x10   Posterior tilts in hooklying with 5 second holds x10   Posterior tilts in hooklying with marching 2x20 each LE, cues to keep breathing to avoid valsalva   Seated forward flexion trunk stretch on theraball x10 seconds x10   LLE slump test positive, RLE slump test negative   Seated sciatic nerve glide x10 each LE   Pt reports 0/10 pain   Lateral lunges x10 each direction without UE support                         PT Education - 10/30/17 1347    Education Details  Exercise technique; what to expect in the healing process for chronic pain; red flags and to notify PT or MD if she experiences any of these symptoms    Person(s) Educated  Patient    Methods  Explanation;Demonstration;Verbal cues    Comprehension  Verbalized  understanding;Returned demonstration;Verbal cues required;Need further instruction       PT Short Term Goals - 10/05/17 1308      PT SHORT TERM GOAL #1   Title  Pt will be independent with HEP in order to improve strength and decrease pain in order to improve pain-free function at home and work.     Time  3    Period  Weeks    Status  New    Target Date  10/25/17        PT Long Term Goals - 10/05/17 1308      PT LONG TERM GOAL #1   Title  Pt will decrease worst pain as reported on NPRS by at least 3 points in order to demonstrate clinically significant reduction in pain.    Baseline  10/04/17: 5/10    Time  6    Period  Weeks    Status  New    Target Date  11/15/17      PT LONG TERM GOAL #2   Title  Pt will increase strength of L hip and L knee by at least 1/2 MMT grade in order to demonstrate improvement in strength and function     Baseline  10/04/17: 4/5 hip extension, 4/5 knee flexion    Time  6    Period  Weeks    Status  New    Target Date  11/15/17      PT LONG TERM GOAL #3   Title  Pt will improve L hip IR ROM by at least 5 degrees to decrease lumbar forces when turning and decrease pain    Baseline  10/04/17: 30 degrees    Time  6    Period  Weeks    Status  New    Target Date  11/15/17            Plan - 10/30/17 1413    Clinical Impression Statement  Pt's calf numbness appears to be centralizing, moving from her L ankle to her L calf.  Explained the clinical explanation for centralization and pt verbalized understanding.  Continued to educate pt on the typical expectation when treating chronic pain. Pt reports new onset of 3 very brief episodes of nubmness in RLE, red flags negative.  Pt reports decreased pain when performing HEP at home and was encouraged to use HEP as a tool when her pain worsens.  Pt demonstrates poor  endurance with posterior tilt and core stability.  Introduced sciatic nerve glide to LLE due to positive slump test. Pt will benefit from  continued skilled PT interventions for decreased pain and improved QOL.     Rehab Potential  Fair    PT Frequency  2x / week    PT Duration  6 weeks    PT Treatment/Interventions  Cryotherapy;Aquatic Therapy;Electrical Stimulation;Canalith Repostioning;Iontophoresis 4mg /ml Dexamethasone;Moist Heat;Traction;Ultrasound;DME Instruction;Gait training;Stair training;Functional mobility training;Therapeutic activities;Therapeutic exercise;Balance training;Neuromuscular re-education;Patient/family education;Manual techniques;Passive range of motion;Dry needling;Vestibular    PT Next Visit Plan  Progress HEP as approprriate to include strengthening, strengthening; consider additional testing to assess involvement of Left SIJ     PT Home Exercise Plan  L HS stretch, L piriformis stretch    Consulted and Agree with Plan of Care  Patient       Patient will benefit from skilled therapeutic intervention in order to improve the following deficits and impairments:  Decreased range of motion, Decreased strength, Pain  Visit Diagnosis: Chronic left-sided low back pain with left-sided sciatica     Problem List There are no active problems to display for this patient.   Collie Siad PT, DPT 10/30/2017, 2:24 PM  [place] MAIN [facility] SERVICES [address] [place], [place], [address] Phone: [phone]   Fax:  [phone]  Name: Caitlin Roy MRN: [id number] Date of Birth: 1940-11-06

## 2017-11-01 ENCOUNTER — Ambulatory Visit: Payer: Medicare Other | Admitting: Physical Therapy

## 2017-11-01 ENCOUNTER — Encounter: Payer: Self-pay | Admitting: Physical Therapy

## 2017-11-01 DIAGNOSIS — M5442 Lumbago with sciatica, left side: Principal | ICD-10-CM

## 2017-11-01 DIAGNOSIS — G8929 Other chronic pain: Secondary | ICD-10-CM

## 2017-11-01 NOTE — Therapy (Signed)
[place] MAIN [facility] SERVICES [address] [place], [place], [address] Phone: [phone]   Fax:  [phone]  Physical Therapy Treatment  Patient Details  Name: Caitlin Roy MRN: [id number] Date of Birth: 08-24-1940 Referring Provider (PT): Whitney Meeler   Encounter Date: 11/01/2017  PT End of Session - 11/01/17 1346    Visit Number  9    Number of Visits  13    Date for PT Re-Evaluation  11/15/17    Authorization Type  Progress note: 9/10    Authorization Time Period  Last goals: 10/04/17    PT Start Time  1346    PT Stop Time  1428    PT Time Calculation (min)  42 min    Activity Tolerance  Patient tolerated treatment well;No increased pain    Behavior During Therapy  WFL for tasks assessed/performed       Past Medical History:  Diagnosis Date  . Hyperlipidemia     Past Surgical History:  Procedure Laterality Date  . ABDOMINAL HYSTERECTOMY    . BREAST BIOPSY Bilateral 10+ years ago   Negative  . CORONARY ARTERY BYPASS GRAFT      There were no vitals filed for this visit.  Subjective Assessment - 11/01/17 1350    Subjective  Pt reports she is feeling better today and believes it is due to her exercises.  Pt had some pain on Wednesday and when she did her exercises it felt better.  Pt is feeling tender in her L buttocks region. Pt says that since starting therapy her central LBP has improved.  Pt reports she was able to bake yesterday which she would not have been able to do before therapy.      Pertinent History  Pt was referred for acute on chronic left low back pain with radiation to left buttock and down the LLE to the level of the calf. Pt reports that her leg felt "tight" like she couldn't bend it and she was having numbness in her toes. She denies any trauma. Worst pain is 5/10, best is 0/10 and current pain is 3/10. Pain worsens as the day goes on. She plans to follow-up with her MD in 3 months. She was treated with 2  prednisone tapers and the second bout improved her symptoms in the lower left leg. She states that she still has some "crawling feelings" in her L lateral calf.  Her pain originally began approximately 2 years ago (2017) without injury. She was evaluated by neurosurgery on 09/20/2017 at which time conservative management was recommended.     Currently in Pain?  Yes    Pain Score  3     Pain Location  Back    Pain Orientation  Left;Lower    Pain Descriptors / Indicators  Aching    Pain Type  Chronic pain    Pain Onset  More than a month ago         TREATMENT   Pt reports 3/10 pain   STM with ischemic compression techniques to L piriformis, glute med, and glute max   Single knee to chest stretch: 10 seconds x10 each LE   Hooklying bridge with cues for glute activation and to raise hips higher from mat table x15   Hooklying isometric clam with band: 10 second holds x10   LAD to LLE 3x30 seconds   Posterior tilts in hooklying with marching x20 each LE, cues to keep breathing to avoid valsalva  Seated forward flexion trunk stretch on theraball x10 seconds x10   Seated sciatic nerve glide x10 each LE   Pt reports 0/10 pain   Lateral lunges x10 each direction without UE support   SLS 3x30 seconds each LE with intermittent UE support                        PT Education - 11/01/17 1346    Education Details  Exercise technique    Person(s) Educated  Patient    Methods  Explanation;Demonstration;Verbal cues    Comprehension  Verbalized understanding;Returned demonstration;Verbal cues required;Need further instruction       PT Short Term Goals - 10/05/17 1308      PT SHORT TERM GOAL #1   Title  Pt will be independent with HEP in order to improve strength and decrease pain in order to improve pain-free function at home and work.     Time  3    Period  Weeks    Status  New    Target Date  10/25/17        PT Long Term Goals - 10/05/17 1308      PT  LONG TERM GOAL #1   Title  Pt will decrease worst pain as reported on NPRS by at least 3 points in order to demonstrate clinically significant reduction in pain.    Baseline  10/04/17: 5/10    Time  6    Period  Weeks    Status  New    Target Date  11/15/17      PT LONG TERM GOAL #2   Title  Pt will increase strength of L hip and L knee by at least 1/2 MMT grade in order to demonstrate improvement in strength and function     Baseline  10/04/17: 4/5 hip extension, 4/5 knee flexion    Time  6    Period  Weeks    Status  New    Target Date  11/15/17      PT LONG TERM GOAL #3   Title  Pt will improve L hip IR ROM by at least 5 degrees to decrease lumbar forces when turning and decrease pain    Baseline  10/04/17: 30 degrees    Time  6    Period  Weeks    Status  New    Target Date  11/15/17            Plan - 11/01/17 1355    Clinical Impression Statement  Pt reports TTP in L piriformis and L glute med/max musculature, thus provided STM with ischemic compression techniques to these regions.  Continued sciatic nerve glide efforts in an effort to reduce sensations in LLE.  Pt is progressing well with strengthening of core and LEs.  As pt with +Scour's test on initial evaluation and pain mainly on L side, introduced LAD to LLE this session.  Pt to trial heat vs. ice to see if she can find any relief with either of these options.  Pt will benefit from continued skilled PT interventions for decreased pain and improved QOL.     Rehab Potential  Fair    PT Frequency  2x / week    PT Duration  6 weeks    PT Treatment/Interventions  Cryotherapy;Aquatic Therapy;Electrical Stimulation;Canalith Repostioning;Iontophoresis 4mg /ml Dexamethasone;Moist Heat;Traction;Ultrasound;DME Instruction;Gait training;Stair training;Functional mobility training;Therapeutic activities;Therapeutic exercise;Balance training;Neuromuscular re-education;Patient/family education;Manual techniques;Passive range of  motion;Dry needling;Vestibular    PT Next Visit Plan  Progress HEP as approprriate to include strengthening, strengthening; consider additional testing to assess involvement of Left SIJ     PT Home Exercise Plan  L HS stretch, L piriformis stretch    Consulted and Agree with Plan of Care  Patient       Patient will benefit from skilled therapeutic intervention in order to improve the following deficits and impairments:  Decreased range of motion, Decreased strength, Pain  Visit Diagnosis: Chronic left-sided low back pain with left-sided sciatica     Problem List There are no active problems to display for this patient.   Collie Siad PT, DPT 11/01/2017, 2:27 PM  [place] MAIN [facility] SERVICES [address] [place], [place], [address] Phone: [phone]   Fax:  [phone]  Name: ARDA DAGGS MRN: [id number] Date of Birth: May 01, 1940

## 2017-11-06 ENCOUNTER — Ambulatory Visit: Payer: Medicare Other | Attending: Family Medicine | Admitting: Physical Therapy

## 2017-11-06 ENCOUNTER — Encounter: Payer: Self-pay | Admitting: Physical Therapy

## 2017-11-06 DIAGNOSIS — G8929 Other chronic pain: Secondary | ICD-10-CM | POA: Diagnosis present

## 2017-11-06 DIAGNOSIS — M5442 Lumbago with sciatica, left side: Secondary | ICD-10-CM | POA: Insufficient documentation

## 2017-11-06 NOTE — Therapy (Signed)
[place] MAIN [facility] SERVICES [address] [place], [place], [address] Phone: [phone]   Fax:  [phone]  Physical Therapy Treatment  Physical Therapy Progress Note   Dates of reporting period  10/04/17   to   11/06/17   Patient Details  Name: Caitlin Roy MRN: [id number] Date of Birth: [birth year] Referring Provider (PT): Whitney Meeler   Encounter Date: 11/06/2017  PT End of Session - 11/06/17 1345    Visit Number  10    Number of Visits  21    Date for PT Re-Evaluation  12/04/17    Authorization Type  Progress note: 10/10; next visit progress note 1/10    Authorization Time Period  Last goals: 10/04/17; next visit start of reporting period 10/1    PT Start Time  1345    PT Stop Time  1429    PT Time Calculation (min)  44 min    Activity Tolerance  Patient tolerated treatment well;No increased pain    Behavior During Therapy  WFL for tasks assessed/performed       Past Medical History:  Diagnosis Date  . Hyperlipidemia     Past Surgical History:  Procedure Laterality Date  . ABDOMINAL HYSTERECTOMY    . BREAST BIOPSY Bilateral 10+ years ago   Negative  . CORONARY ARTERY BYPASS GRAFT      There were no vitals filed for this visit.  Subjective Assessment - 11/06/17 1352    Subjective  Pt reports she had a steroid injection into L hip this morning with no precautions but told pt she may have some soreness from this.  Pt reports her tingling has gotten better in her left leg and is not experiencing this right now.  Pt reports that since starting therapy she has seen 40% improvement.  Pt reports that she has some days without pain.  Pt says when she is walking her legs feel stronger.     Pertinent History  Pt was referred for acute on chronic left low back pain with radiation to left buttock and down the LLE to the level of the calf. Pt reports that her leg felt "tight" like she couldn't bend it and she was having numbness in her  toes. She denies any trauma. Worst pain is 5/10, best is 0/10 and current pain is 3/10. Pain worsens as the day goes on. She plans to follow-up with her MD in 3 months. She was treated with 2 prednisone tapers and the second bout improved her symptoms in the lower left leg. She states that she still has some "crawling feelings" in her L lateral calf.  Her pain originally began approximately 2 years ago (2017) without injury. She was evaluated by neurosurgery on 09/20/2017 at which time conservative management was recommended.     Currently in Pain?  Yes    Pain Score  3     Pain Location  Back    Pain Orientation  Lower   middle   Pain Onset  More than a month ago          TREATMENT  Pt reports current pain: 3/10  Worst pain: 6/10  LLE MMT4/5 hip extension, 4+/5 knee flexion  L hip AROM in deg: 33  MODI: 20%  Double knee to chest 10 seconds x10 each LE  Lower lumbar rotations in hooklying x10 each side  Hooklying bridge with cues for glute activation and to raise hips higher from mat table x15  Hooklying isometric  clam with band: 10 second holds x10  Seated sciatic nerve glide x10 each LE  Lateral lunges x10 each direction without UE support                   Patient's condition has the potential to improve in response to therapy. Maximum improvement is yet to be obtained. The anticipated improvement is attainable and reasonable in a generally predictable time.       PT Education - 11/06/17 1345    Education Details  Exercise technique; progress toward goals    Person(s) Educated  Patient    Methods  Explanation;Demonstration;Verbal cues    Comprehension  Verbalized understanding;Returned demonstration;Verbal cues required;Need further instruction       PT Short Term Goals - 11/06/17 1407      PT SHORT TERM GOAL #1   Title  Pt will be independent with HEP in order to improve strength and decrease pain in order to improve pain-free function at home and work.      Baseline  10/1: Pt performing HEP at least 6 days/wk    Time  3    Period  Weeks    Status  Achieved        PT Long Term Goals - 11/06/17 1358      PT LONG TERM GOAL #1   Title  Pt will decrease worst pain as reported on NPRS by at least 3 points in order to demonstrate clinically significant reduction in pain.    Baseline  10/04/17: 5/10; 10/1: 6/10    Time  6    Period  Weeks    Status  On-going      PT LONG TERM GOAL #2   Title  Pt will increase strength of L hip and L knee by at least 1/2 MMT grade in order to demonstrate improvement in strength and function     Baseline  10/04/17: 4/5 hip extension, 4/5 knee flexion; 10/1: 4/5 hip extension, 4+/5 knee flexion    Time  6    Period  Weeks    Status  Partially Met      PT LONG TERM GOAL #3   Title  Pt will improve L hip IR ROM by at least 5 degrees to decrease lumbar forces when turning and decrease pain    Baseline  10/04/17: 30 degrees; 10/1: 33 deg    Time  6    Period  Weeks    Status  Partially Met      PT LONG TERM GOAL #4   Title  Pt will report at least 70% improvement in pain since starting therapy to demonstrate improved QOL    Baseline  10/1: 40%    Time  4    Period  Weeks    Status  New      PT LONG TERM GOAL #5   Title  Pt's mODI will improve to less than or equal 10 to demonstrate decreased effect of pain on daily activities    Baseline  10/1: 20%    Time  4    Period  Weeks    Status  New            Plan - 11/06/17 1420    Clinical Impression Statement  Pt demonstrates improved R knee flexion strength and slightly improved L hip IR AROM.  Pt's day to day pain has decreased and pt reports not experiencing as much tingling in LLE since starting therapy. Pt reports feeling 40% improvement  since starting therapy but would like to achieve at least 70%.  Deferred continuation of LAD to next session as pt just had injection to L hip this morning.  Pt completed the mODI this session with a score of 20%  indicating pt's pain affects her daily activities. Pt will benefit from continued skilled PT interventions for decreased pain and improved QOL.     Rehab Potential  Fair    PT Frequency  2x / week    PT Duration  4 weeks    PT Treatment/Interventions  Cryotherapy;Aquatic Therapy;Electrical Stimulation;Canalith Repostioning;Iontophoresis 75m/ml Dexamethasone;Moist Heat;Traction;Ultrasound;DME Instruction;Gait training;Stair training;Functional mobility training;Therapeutic activities;Therapeutic exercise;Balance training;Neuromuscular re-education;Patient/family education;Manual techniques;Passive range of motion;Dry needling;Vestibular    PT Next Visit Plan  Progress HEP as approprriate to include strengthening, strengthening; consider additional testing to assess involvement of Left SIJ     PT Home Exercise Plan  L HS stretch, L piriformis stretch, double knee to chest    Consulted and Agree with Plan of Care  Patient       Patient will benefit from skilled therapeutic intervention in order to improve the following deficits and impairments:  Decreased range of motion, Decreased strength, Pain  Visit Diagnosis: Chronic left-sided low back pain with left-sided sciatica     Problem List There are no active problems to display for this patient.   ACollie SiadPT, DPT 11/06/2017, 2:32 PM  [place] [facility] [address] [address]Phone: [phone]  Fax:  [phone] Name: Caitlin PUDERMRN: [id number] of Birth: [birth year]

## 2017-11-08 ENCOUNTER — Ambulatory Visit: Payer: Medicare Other | Admitting: Physical Therapy

## 2017-11-08 ENCOUNTER — Encounter: Payer: Self-pay | Admitting: Physical Therapy

## 2017-11-08 DIAGNOSIS — M5442 Lumbago with sciatica, left side: Secondary | ICD-10-CM | POA: Diagnosis not present

## 2017-11-08 DIAGNOSIS — G8929 Other chronic pain: Secondary | ICD-10-CM

## 2017-11-08 NOTE — Therapy (Signed)
[place] MAIN [facility] SERVICES [address] [place], [place], [address] Phone: [phone]   Fax:  [phone]  Physical Therapy Treatment  Patient Details  Name: Caitlin Roy MRN: [id number] Date of Birth: 1940/04/20 Referring Provider (PT): Whitney Meeler   Encounter Date: 11/08/2017  PT End of Session - 11/08/17 1330    Visit Number  11    Number of Visits  21    Date for PT Re-Evaluation  12/04/17    Authorization Type  progress note 1/10    Authorization Time Period  start of reporting period 10/1    PT Start Time  1332    PT Stop Time  1423    PT Time Calculation (min)  51 min    Activity Tolerance  Patient tolerated treatment well;No increased pain    Behavior During Therapy  WFL for tasks assessed/performed       Past Medical History:  Diagnosis Date  . Hyperlipidemia     Past Surgical History:  Procedure Laterality Date  . ABDOMINAL HYSTERECTOMY    . BREAST BIOPSY Bilateral 10+ years ago   Negative  . CORONARY ARTERY BYPASS GRAFT      There were no vitals filed for this visit.  Subjective Assessment - 11/08/17 1334    Subjective  Pt reports she is having some pain in her gluteal region on the L.  Pt says she had very minimal tingling around L ankle last night which has resolved.      Pertinent History  Pt was referred for acute on chronic left low back pain with radiation to left buttock and down the LLE to the level of the calf. Pt reports that her leg felt "tight" like she couldn't bend it and she was having numbness in her toes. She denies any trauma. Worst pain is 5/10, best is 0/10 and current pain is 3/10. Pain worsens as the day goes on. She plans to follow-up with her MD in 3 months. She was treated with 2 prednisone tapers and the second bout improved her symptoms in the lower left leg. She states that she still has some "crawling feelings" in her L lateral calf.  Her pain originally began approximately 2 years ago  (2017) without injury. She was evaluated by neurosurgery on 09/20/2017 at which time conservative management was recommended.     Currently in Pain?  Yes    Pain Score  4     Pain Location  --   glute   Pain Orientation  Left    Pain Descriptors / Indicators  Aching    Pain Type  Chronic pain    Pain Onset  More than a month ago       TREATMENT   Double knee to chest 10 seconds x10 each LE   Lower lumbar rotations in hooklying x10 each side   Hooklying bridge with cues for glute activation and to raise hips higher from mat table x15   Hooklying isometric clam with band: 10 second holds x10   STM to L glute med as increased tension noted   Seated sciatic nerve glide x10 each LE   Seated forward flexion trunk stretch on theraball x10 seconds x10   Standing at wall posterior pelvic tilts with 5 second holds; pt unable to perform with proper technique despite demonstration and tactile feedback   Seated posterior pelvic tilts with 5 second holds with tactile cues for proper technique   Lateral walking with RTB around  knees 2x50 ft (added to HEP)   Forward and backward diagonal walking with RTB around knees 2x50 ft                          PT Education - 11/08/17 1329    Education Details  Exercise technique    Person(s) Educated  Patient    Methods  Explanation;Demonstration;Verbal cues    Comprehension  Verbalized understanding;Returned demonstration;Verbal cues required;Need further instruction       PT Short Term Goals - 11/06/17 1407      PT SHORT TERM GOAL #1   Title  Pt will be independent with HEP in order to improve strength and decrease pain in order to improve pain-free function at home and work.     Baseline  10/1: Pt performing HEP at least 6 days/wk    Time  3    Period  Weeks    Status  Achieved        PT Long Term Goals - 11/06/17 1358      PT LONG TERM GOAL #1   Title  Pt will decrease worst pain as reported on NPRS by at  least 3 points in order to demonstrate clinically significant reduction in pain.    Baseline  10/04/17: 5/10; 10/1: 6/10    Time  6    Period  Weeks    Status  On-going      PT LONG TERM GOAL #2   Title  Pt will increase strength of L hip and L knee by at least 1/2 MMT grade in order to demonstrate improvement in strength and function     Baseline  10/04/17: 4/5 hip extension, 4/5 knee flexion; 10/1: 4/5 hip extension, 4+/5 knee flexion    Time  6    Period  Weeks    Status  Partially Met      PT LONG TERM GOAL #3   Title  Pt will improve L hip IR ROM by at least 5 degrees to decrease lumbar forces when turning and decrease pain    Baseline  10/04/17: 30 degrees; 10/1: 33 deg    Time  6    Period  Weeks    Status  Partially Met      PT LONG TERM GOAL #4   Title  Pt will report at least 70% improvement in pain since starting therapy to demonstrate improved QOL    Baseline  10/1: 40%    Time  4    Period  Weeks    Status  New      PT LONG TERM GOAL #5   Title  Pt's mODI will improve to less than or equal 10 to demonstrate decreased effect of pain on daily activities    Baseline  10/1: 20%    Time  4    Period  Weeks    Status  New            Plan - 11/08/17 1338    Clinical Impression Statement  Pt reported some relief with STM to L glute med region as this region was pt's main site of pain today.  Progressed posterior pelvic tilts to sitting which pt was able to successfully hold for 5 second intervals.  Pt was unable to perform with proper technique at wall. Introduced lateral walking and diagonal walking this session for gluteal strengthening.    Rehab Potential  Fair    PT Frequency  2x / week  PT Duration  4 weeks    PT Treatment/Interventions  Cryotherapy;Aquatic Therapy;Electrical Stimulation;Canalith Repostioning;Iontophoresis 48m/ml Dexamethasone;Moist Heat;Traction;Ultrasound;DME Instruction;Gait training;Stair training;Functional mobility training;Therapeutic  activities;Therapeutic exercise;Balance training;Neuromuscular re-education;Patient/family education;Manual techniques;Passive range of motion;Dry needling;Vestibular    PT Next Visit Plan  Progress HEP as approprriate to include strengthening, strengthening; consider additional testing to assess involvement of Left SIJ     PT Home Exercise Plan  L HS stretch with rope, L piriformis stretch, double knee to chest, single knee to chest, lower lumbar rotations, bridges, lateral walking with RTB    Consulted and Agree with Plan of Care  Patient       Patient will benefit from skilled therapeutic intervention in order to improve the following deficits and impairments:  Decreased range of motion, Decreased strength, Pain  Visit Diagnosis: Chronic left-sided low back pain with left-sided sciatica     Problem List There are no active problems to display for this patient.   ACollie SiadPT, DPT 11/08/2017, 2:37 PM  CLeoniaMAIN REdward HospitalSERVICES [address] [address]Phone: [phone]  Fax:  [phone] Name: Caitlin KYTEMRN: [id number] of Birth: 8May 15, 1942

## 2017-11-08 NOTE — Patient Instructions (Signed)
Access Code: [email or web address]  URL: [email or web address]  Date: 11/08/2017  Prepared by: Collie Siad   Exercises  Side Stepping with Resistance at Thighs - 20 reps - 2 sets - 1x daily - 7x weekly

## 2017-11-13 ENCOUNTER — Ambulatory Visit: Payer: Medicare Other

## 2017-11-13 DIAGNOSIS — M5442 Lumbago with sciatica, left side: Secondary | ICD-10-CM | POA: Diagnosis not present

## 2017-11-13 DIAGNOSIS — G8929 Other chronic pain: Secondary | ICD-10-CM

## 2017-11-13 NOTE — Therapy (Signed)
[place] MAIN [facility] SERVICES [address] [place], [place], [address] Phone: [phone]   Fax:  [phone]  Physical Therapy Treatment   Patient Details  Name: Caitlin Roy MRN: [id number] Date of Birth: May 25, 1940 Referring Provider (PT): Whitney Meeler   Encounter Date: 11/13/2017  PT End of Session - 11/13/17 1350    Visit Number  12    Number of Visits  21    Date for PT Re-Evaluation  12/04/17    Authorization Type  progress note 2/10    Authorization Time Period  start of reporting period 10/1    PT Start Time  1345    PT Stop Time  1430    PT Time Calculation (min)  45 min    Activity Tolerance  Patient tolerated treatment well;No increased pain    Behavior During Therapy  WFL for tasks assessed/performed       Past Medical History:  Diagnosis Date  . Hyperlipidemia     Past Surgical History:  Procedure Laterality Date  . ABDOMINAL HYSTERECTOMY    . BREAST BIOPSY Bilateral 10+ years ago   Negative  . CORONARY ARTERY BYPASS GRAFT      There were no vitals filed for this visit.  Subjective Assessment - 11/13/17 1348    Subjective  Pt reports that she is having 4/10 L low back upon arrival today. The hip injection helped with her pain. No specific questions or concerns at this time.     Pertinent History  Pt was referred for acute on chronic left low back pain with radiation to left buttock and down the LLE to the level of the calf. Pt reports that her leg felt "tight" like she couldn't bend it and she was having numbness in her toes. She denies any trauma. Worst pain is 5/10, best is 0/10 and current pain is 3/10. Pain worsens as the day goes on. She plans to follow-up with her MD in 3 months. She was treated with 2 prednisone tapers and the second bout improved her symptoms in the lower left leg. She states that she still has some "crawling feelings" in her L lateral calf.  Her pain originally began approximately 2 years ago  (2017) without injury. She was evaluated by neurosurgery on 09/20/2017 at which time conservative management was recommended.     Diagnostic tests  [facility] lumbar spine without contrast [facility]: L5-S1 there is severe bilateral facet arthropathy with shallow disc bulge. Severe left-sided foraminal stenosis moderate right-sided foraminal stenosis. L4-5 severe bilateral facet arthropathy with shallow disc bulge. Borderline central stenosis. Moderate left and mild right foraminal stenosis. L3-4 shallow disc bulge with mild bilateral facet arthropathy. No central or foraminal stenosis. L2-3 left sided disc bulge without central or foraminal stenosis. L1-2 shallow disc bulge without central or foraminal stenosis.    Patient Stated Goals  Decrease her back pain    Currently in Pain?  Yes    Pain Score  4     Pain Location  Back    Pain Orientation  Left;Lower    Pain Descriptors / Indicators  Aching    Pain Type  Chronic pain    Pain Onset  More than a month ago            TREATMENT   Ther-ex  NuStep L2 x 5 minutes for warm-up during history (3 minutes unbilled), moist heat pack applied to low back; Lower lumbar rotations in hooklying x 10 each side  Hooklying posterior pelvic tilts 3s hold x 10; R sidelying L hip abduction x 10; Seated clams with manual resistance x 10; Seated adductor squeeze with manual resistance x 10; Hooklying bridges 2 x 15;   Manual Therapy  Long axis L hip distraction with belt assist 30s/bout x 3 bouts, no increase in pain; L single knee to chest stretch 20s hold x 3; L HS stretch 30s hold x 3 with added DF/PF for sciatic nerve gliding;  STM to L glute med as increased tension noted, utilized roller to assist with soft tissue mobility. Prone L hip IR stretch 30s hold x 2; Prone L hip PA mobs to improve extension, grade II-III, 30s/bout x 3 bouts;   Pt educated throughout session about proper posture and technique with exercises. Improved exercise technique,  movement at target joints, use of target muscles after min to mod verbal, visual, tactile cues.    Pt reported significant relief with with STM to L glute med/max region as this region was pt's main site of pain today again. Recommended trigger point dry needling (TDN) to this area and pt states that she will consider. Progressed strengthening exercises as well today and pt is able to complete without any increase in pain. Pt encouraged to continue HEP and follow-up as scheduled. Pt will benefit from PT services to address deficits in strength, balance, and mobility in order to return to full function at home.                       PT Short Term Goals - 11/06/17 1407      PT SHORT TERM GOAL #1   Title  Pt will be independent with HEP in order to improve strength and decrease pain in order to improve pain-free function at home and work.     Baseline  10/1: Pt performing HEP at least 6 days/wk    Time  3    Period  Weeks    Status  Achieved        PT Long Term Goals - 11/06/17 1358      PT LONG TERM GOAL #1   Title  Pt will decrease worst pain as reported on NPRS by at least 3 points in order to demonstrate clinically significant reduction in pain.    Baseline  10/04/17: 5/10; 10/1: 6/10    Time  6    Period  Weeks    Status  On-going      PT LONG TERM GOAL #2   Title  Pt will increase strength of L hip and L knee by at least 1/2 MMT grade in order to demonstrate improvement in strength and function     Baseline  10/04/17: 4/5 hip extension, 4/5 knee flexion; 10/1: 4/5 hip extension, 4+/5 knee flexion    Time  6    Period  Weeks    Status  Partially Met      PT LONG TERM GOAL #3   Title  Pt will improve L hip IR ROM by at least 5 degrees to decrease lumbar forces when turning and decrease pain    Baseline  10/04/17: 30 degrees; 10/1: 33 deg    Time  6    Period  Weeks    Status  Partially Met      PT LONG TERM GOAL #4   Title  Pt will report at least 70%  improvement in pain since starting therapy to demonstrate improved QOL    Baseline  10/1: 40%    Time  4    Period  Weeks    Status  New      PT LONG TERM GOAL #5   Title  Pt's mODI will improve to less than or equal 10 to demonstrate decreased effect of pain on daily activities    Baseline  10/1: 20%    Time  4    Period  Weeks    Status  New            Plan - 11/13/17 1350    Clinical Impression Statement  Pt reported significant relief with with STM to L glute med/max region as this region was pt's main site of pain today again. Recommended trigger point dry needling (TDN) to this area and pt states that she will consider. Progressed strengthening exercises as well today and pt is able to complete without any increase in pain. Pt encouraged to continue HEP and follow-up as scheduled. Pt will benefit from PT services to address deficits in strength, balance, and mobility in order to return to full function at home.     Rehab Potential  Fair    PT Frequency  2x / week    PT Duration  4 weeks    PT Treatment/Interventions  Cryotherapy;Aquatic Therapy;Electrical Stimulation;Canalith Repostioning;Iontophoresis 73m/ml Dexamethasone;Moist Heat;Traction;Ultrasound;DME Instruction;Gait training;Stair training;Functional mobility training;Therapeutic activities;Therapeutic exercise;Balance training;Neuromuscular re-education;Patient/family education;Manual techniques;Passive range of motion;Dry needling;Vestibular    PT Next Visit Plan  Progress HEP as approprriate to include strengthening, strengthening; consider additional testing to assess involvement of Left SIJ and possibly TDN to glut med/max    PT Home Exercise Plan  L HS stretch with rope, L piriformis stretch, double knee to chest, single knee to chest, lower lumbar rotations, bridges, lateral walking with RTB    Consulted and Agree with Plan of Care  Patient       Patient will benefit from skilled therapeutic intervention in order  to improve the following deficits and impairments:  Decreased range of motion, Decreased strength, Pain  Visit Diagnosis: Chronic left-sided low back pain with left-sided sciatica     Problem List There are no active problems to display for this patient.  JPhillips GroutPT, DPT, GCS  Huprich,Jason 11/13/2017, 4:00 PM  CTraerMAIN REye Surgery [facility] LLCSERVICES [address] [place] [address]Phone: [phone]  Fax:  [phone] Name: MDYNASTEE BRUMMELLMRN: [id number] of Birth: [birth year]

## 2017-11-15 ENCOUNTER — Ambulatory Visit: Payer: Medicare Other | Admitting: Physical Therapy

## 2017-11-15 DIAGNOSIS — M5442 Lumbago with sciatica, left side: Secondary | ICD-10-CM | POA: Diagnosis not present

## 2017-11-15 DIAGNOSIS — G8929 Other chronic pain: Secondary | ICD-10-CM

## 2017-11-15 NOTE — Therapy (Signed)
[place] MAIN [facility]: [facility] SERVICES [address] [place], [place], [address] Phone: [phone]   Fax:  [phone]  Physical Therapy Treatment  Patient Details  Name: Caitlin Roy MRN: [id number] Date of Birth: October 28, 1940 Referring Provider (PT): Whitney Meeler   Encounter Date: 11/15/2017  PT End of Session - 11/15/17 1429    Visit Number  13    Number of Visits  21    Date for PT Re-Evaluation  12/04/17    Authorization Type  progress note 2/10    Authorization Time Period  start of reporting period 10/1    PT Start Time  1350    PT Stop Time  1430    PT Time Calculation (min)  40 min    Activity Tolerance  Patient tolerated treatment well;No increased pain    Behavior During Therapy  WFL for tasks assessed/performed       Past Medical History:  Diagnosis Date  . Hyperlipidemia     Past Surgical History:  Procedure Laterality Date  . ABDOMINAL HYSTERECTOMY    . BREAST BIOPSY Bilateral 10+ years ago   Negative  . CORONARY ARTERY BYPASS GRAFT      There were no vitals filed for this visit.  Subjective Assessment - 11/15/17 1400    Subjective  Pt reports conitnued pain in her Lt Low back 4-5/10, denies any obvious help form  (Pended)     Pertinent History  Pt was referred for acute on chronic left low back pain with radiation to left buttock Caitlin down the LLE to the level of the calf. Pt reports that her leg felt "tight" like she couldn't bend it Caitlin she was having numbness in her toes. She denies any trauma. Worst pain is 5/10, best is 0/10 Caitlin current pain is 3/10. Pain worsens as the day goes on. She plans to follow-up with her MD in 3 months. She was treated with 2 prednisone tapers Caitlin the second bout improved her symptoms in the lower left leg. She states that she still has some "crawling feelings" in her L lateral calf.  Her pain originally began approximately 2 years ago (2017) without injury. She was evaluated by neurosurgery on  09/20/2017 at which time conservative management was recommended.   (Pended)          TREATMENT    Ther-ex  Seated chair trunk rotation stretch: 3x30sec bilat (VC to keep stretchy but avoid painful excursion)  Supine chair traction posture with legs elevated 1x3 minutes  Seated lumbar flexion stretch (arms resting on plinth) 3x30sec (pt reports to feel good)  Rt sidelying L hip abduction 3x10 (intermittent myofascial release at Lt glute med near iliac crest) 3x60  Hooklying bridges 2 x 15; *feet near buttocks to reduce relative Lumbar spine loading  Supine c Legs elevated to 45 degrees for hamstrings mediated lumbar spine flexion stretch1x5 minutes *aiming to target spastic lumbar paraspinals Caitlin facilitate opening of neurological spaces in lumbar spine     PT Short Term Goals - 11/06/17 1407      PT SHORT TERM GOAL #1   Title  Pt will be independent with HEP in order to improve strength Caitlin decrease pain in order to improve pain-free function at home Caitlin work.     Baseline  10/1: Pt performing HEP at least 6 days/wk    Time  3    Period  Weeks    Status  Achieved        PT  Long Term Goals - 11/06/17 1358      PT LONG TERM GOAL #1   Title  Pt will decrease worst pain as reported on NPRS by at least 3 points in order to demonstrate clinically significant reduction in pain.    Baseline  10/04/17: 5/10; 10/1: 6/10    Time  6    Period  Weeks    Status  On-going      PT LONG TERM GOAL #2   Title  Pt will increase strength of L hip Caitlin L knee by at least 1/2 MMT grade in order to demonstrate improvement in strength Caitlin function     Baseline  10/04/17: 4/5 hip extension, 4/5 knee flexion; 10/1: 4/5 hip extension, 4+/5 knee flexion    Time  6    Period  Weeks    Status  Partially Met      PT LONG TERM GOAL #3   Title  Pt will improve L hip IR ROM by at least 5 degrees to decrease lumbar forces when turning Caitlin decrease pain    Baseline  10/04/17: 30 degrees; 10/1: 33 deg    Time   6    Period  Weeks    Status  Partially Met      PT LONG TERM GOAL #4   Title  Pt will report at least 70% improvement in pain since starting therapy to demonstrate improved QOL    Baseline  10/1: 40%    Time  4    Period  Weeks    Status  New      PT LONG TERM GOAL #5   Title  Pt's mODI will improve to less than or equal 10 to demonstrate decreased effect of pain on daily activities    Baseline  10/1: 20%    Time  4    Period  Weeks    Status  New            Plan - 11/15/17 1430    Clinical Impression Statement  Pt continues to struggle with consistent pain in Left posterior pelvis. Pt reports continued aggravation of pain with deep palpation of glute medius at the superior most border of the posterior iliac crest on the Left. Educational intervention regarding dry needling is repeated as pt has many questions. Pt does responds favorably to attempted interventions to improve lumbar flexion mobility as patient has no available flexion (stuck in lordosis). Pt reports considering some needling in future visit.     Rehab Potential  Fair    PT Frequency  2x / week    PT Duration  4 weeks    PT Treatment/Interventions  Cryotherapy;Aquatic Therapy;Electrical Stimulation;Canalith Repostioning;Iontophoresis 49m/ml Dexamethasone;Moist Heat;Traction;Ultrasound;DME Instruction;Gait training;Stair training;Functional mobility training;Therapeutic activities;Therapeutic exercise;Balance training;Neuromuscular re-education;Patient/family education;Manual techniques;Passive range of motion;Dry needling;Vestibular    PT Next Visit Plan  Progress HEP as approprriate to include strengthening, strengthening; consider additional testing to assess involvement of Left SIJ Caitlin possibly TDN to glut med/max    PT Home Exercise Plan  L HS stretch with rope, L piriformis stretch, double knee to chest, single knee to chest, lower lumbar rotations, bridges, lateral walking with RTB    Consulted Caitlin Agree with  Plan of Care  Patient       Patient will benefit from skilled therapeutic intervention in order to improve the following deficits Caitlin impairments:  Decreased range of motion, Decreased strength, Pain  Visit Diagnosis: Chronic left-sided low back pain with left-sided sciatica  Problem List There are no active problems to display for this patient.   Buccola,Allan C 11/15/2017, 2:32 PM  [place] MAIN [facility] SERVICES [address] [place], [place], [address] Phone: [phone]   Fax:  [phone]  Name: ANDE Roy MRN: [id number] Date of Birth: Feb 27, 1940

## 2017-11-20 ENCOUNTER — Encounter: Payer: Self-pay | Admitting: Physical Therapy

## 2017-11-20 ENCOUNTER — Ambulatory Visit: Payer: Medicare Other | Admitting: Physical Therapy

## 2017-11-20 DIAGNOSIS — M5442 Lumbago with sciatica, left side: Secondary | ICD-10-CM | POA: Diagnosis not present

## 2017-11-20 DIAGNOSIS — G8929 Other chronic pain: Secondary | ICD-10-CM

## 2017-11-20 NOTE — Therapy (Signed)
[place] MAIN [facility] SERVICES [address] [place], [place], [address] Phone: [phone]   Fax:  [phone]  Physical Therapy Treatment  Patient Details  Name: Caitlin Roy MRN: [id number] Date of Birth: 04/02/1940 Referring Provider (PT): Whitney Meeler   Encounter Date: 11/20/2017  PT End of Session - 11/20/17 1343    Visit Number  14    Number of Visits  21    Date for PT Re-Evaluation  12/04/17    Authorization Type  progress note 3/10    Authorization Time Period  start of reporting period 10/1    PT Start Time  1353    PT Stop Time  1427    PT Time Calculation (min)  34 min    Activity Tolerance  Patient tolerated treatment well;No increased pain    Behavior During Therapy  WFL for tasks assessed/performed       Past Medical History:  Diagnosis Date  . Hyperlipidemia     Past Surgical History:  Procedure Laterality Date  . ABDOMINAL HYSTERECTOMY    . BREAST BIOPSY Bilateral 10+ years ago   Negative  . CORONARY ARTERY BYPASS GRAFT      There were no vitals filed for this visit.  Subjective Assessment - 11/20/17 1356    Subjective  Pt went shopping for 6 hrs on Saturday which made her tired but denies pain with this.  Pt reports she felt that her last session was helpful to decrease her pain.     Pertinent History  Pt was referred for acute on chronic left low back pain with radiation to left buttock and down the LLE to the level of the calf. Pt reports that her leg felt "tight" like she couldn't bend it and she was having numbness in her toes. She denies any trauma. Worst pain is 5/10, best is 0/10 and current pain is 3/10. Pain worsens as the day goes on. She plans to follow-up with her MD in 3 months. She was treated with 2 prednisone tapers and the second bout improved her symptoms in the lower left leg. She states that she still has some "crawling feelings" in her L lateral calf.  Her pain originally began approximately 2  years ago (2017) without injury. She was evaluated by neurosurgery on 09/20/2017 at which time conservative management was recommended.     Currently in Pain?  Yes    Pain Score  1     Pain Location  Back    Pain Orientation  Lower    Pain Descriptors / Indicators  Aching    Pain Type  Chronic pain        TREATMENT   Lower lumbar rotations in hooklying x 10 each side?   Rt sidelying L hip abduction 2x15   Hooklying bridges 2 x 15; cues for feet near bottom to target glute max   Supine Bil hamstring stretch 2x45 seconds with belt   Seated lumbar flexion stretch (arms resting on plinth) 3x30sec (pt reports to feel good)   Sideways walking with GTB around knees with slight knee bend for greater glute activation. 2x50 ft each direction.               PT Education - 11/20/17 1343    Education Details  Exercise technique    Person(s) Educated  Patient    Methods  Explanation;Demonstration;Verbal cues    Comprehension  Verbalized understanding;Returned demonstration;Verbal cues required;Need further instruction  PT Short Term Goals - 11/06/17 1407      PT SHORT TERM GOAL #1   Title  Pt will be independent with HEP in order to improve strength and decrease pain in order to improve pain-free function at home and work.     Baseline  10/1: Pt performing HEP at least 6 days/wk    Time  3    Period  Weeks    Status  Achieved        PT Long Term Goals - 11/06/17 1358      PT LONG TERM GOAL #1   Title  Pt will decrease worst pain as reported on NPRS by at least 3 points in order to demonstrate clinically significant reduction in pain.    Baseline  10/04/17: 5/10; 10/1: 6/10    Time  6    Period  Weeks    Status  On-going      PT LONG TERM GOAL #2   Title  Pt will increase strength of L hip and L knee by at least 1/2 MMT grade in order to demonstrate improvement in strength and function     Baseline  10/04/17: 4/5 hip extension, 4/5 knee flexion; 10/1: 4/5 hip  extension, 4+/5 knee flexion    Time  6    Period  Weeks    Status  Partially Met      PT LONG TERM GOAL #3   Title  Pt will improve L hip IR ROM by at least 5 degrees to decrease lumbar forces when turning and decrease pain    Baseline  10/04/17: 30 degrees; 10/1: 33 deg    Time  6    Period  Weeks    Status  Partially Met      PT LONG TERM GOAL #4   Title  Pt will report at least 70% improvement in pain since starting therapy to demonstrate improved QOL    Baseline  10/1: 40%    Time  4    Period  Weeks    Status  New      PT LONG TERM GOAL #5   Title  Pt's mODI will improve to less than or equal 10 to demonstrate decreased effect of pain on daily activities    Baseline  10/1: 20%    Time  4    Period  Weeks    Status  New            Plan - 11/20/17 1358    Clinical Impression Statement  Pt reports her pain decreased after last session, thus had pt complete similar interventions this session.  Did not repeat STM to glute med as pt denies pain in this area. Pt demonstrates fatigue with sidelying L hip abd and will benefit from further strengthening of gluteals.  Pt will benefit from continued skilled PT interventions for decreased pain and improved QOL.      Rehab Potential  Fair    PT Frequency  2x / week    PT Duration  4 weeks    PT Treatment/Interventions  Cryotherapy;Aquatic Therapy;Electrical Stimulation;Canalith Repostioning;Iontophoresis 80m/ml Dexamethasone;Moist Heat;Traction;Ultrasound;DME Instruction;Gait training;Stair training;Functional mobility training;Therapeutic activities;Therapeutic exercise;Balance training;Neuromuscular re-education;Patient/family education;Manual techniques;Passive range of motion;Dry needling;Vestibular    PT Next Visit Plan  Progress HEP as approprriate to include strengthening, strengthening; consider additional testing to assess involvement of Left SIJ and possibly TDN to glut med/max    PT Home Exercise Plan  L HS stretch with  rope, L piriformis stretch, double knee  to chest, single knee to chest, lower lumbar rotations, bridges, lateral walking with RTB    Consulted and Agree with Plan of Care  Patient       Patient will benefit from skilled therapeutic intervention in order to improve the following deficits and impairments:  Decreased range of motion, Decreased strength, Pain  Visit Diagnosis: Chronic left-sided low back pain with left-sided sciatica     Problem List There are no active problems to display for this patient.   Collie Siad PT, DPT 11/20/2017, 2:28 PM  [place] MAIN [facility] [place] SERVICES [address] [place], [place], [address] Phone: [phone]   Fax:  [phone]  Name: Caitlin Roy MRN: [id number] Date of Birth: 03-06-1940

## 2017-11-22 ENCOUNTER — Ambulatory Visit: Payer: Medicare Other

## 2017-11-22 DIAGNOSIS — G8929 Other chronic pain: Secondary | ICD-10-CM

## 2017-11-22 DIAGNOSIS — M5442 Lumbago with sciatica, left side: Principal | ICD-10-CM

## 2017-11-22 NOTE — Therapy (Signed)
[place] MAIN [facility] SERVICES [address] [place], [place], [address] Phone: [phone]   Fax:  [phone]  Physical Therapy Treatment  Patient Details  Name: Caitlin Roy MRN: [id number] Date of Birth: 1941/01/14 Referring Provider (PT): Whitney Meeler   Encounter Date: 11/22/2017  PT End of Session - 11/22/17 1338    Visit Number  15    Number of Visits  21    Date for PT Re-Evaluation  12/04/17    Authorization Type  progress note 4/10    Authorization Time Period  start of reporting period 10/1    PT Start Time  1345    PT Stop Time  1430    PT Time Calculation (min)  45 min    Activity Tolerance  Patient tolerated treatment well;No increased pain    Behavior During Therapy  WFL for tasks assessed/performed       Past Medical History:  Diagnosis Date  . Hyperlipidemia     Past Surgical History:  Procedure Laterality Date  . ABDOMINAL HYSTERECTOMY    . BREAST BIOPSY Bilateral 10+ years ago   Negative  . CORONARY ARTERY BYPASS GRAFT      There were no vitals filed for this visit.  Subjective Assessment - 11/22/17 1337    Subjective  Pt states that in general she feels significantly better since starting therapy. She reports approximately 60% improvement since starting therapy. She was putting plants in the garage and basement this morning and states is a little sore.     Pertinent History  Pt was referred for acute on chronic left low back pain with radiation to left buttock and down the LLE to the level of the calf. Pt reports that her leg felt "tight" like she couldn't bend it and she was having numbness in her toes. She denies any trauma. Worst pain is 5/10, best is 0/10 and current pain is 3/10. Pain worsens as the day goes on. She plans to follow-up with her MD in 3 months. She was treated with 2 prednisone tapers and the second bout improved her symptoms in the lower left leg. She states that she still has some "crawling  feelings" in her L lateral calf.  Her pain originally began approximately 2 years ago (2017) without injury. She was evaluated by neurosurgery on 09/20/2017 at which time conservative management was recommended.     Diagnostic tests  MRI lumbar spine without contrast from [facility]: L5-S1 there is severe bilateral facet arthropathy with shallow disc bulge. Severe left-sided foraminal stenosis moderate right-sided foraminal stenosis. L4-5 severe bilateral facet arthropathy with shallow disc bulge. Borderline central stenosis. Moderate left and mild right foraminal stenosis. L3-4 shallow disc bulge with mild bilateral facet arthropathy. No central or foraminal stenosis. L2-3 left sided disc bulge without central or foraminal stenosis. L1-2 shallow disc bulge without central or foraminal stenosis.    Patient Stated Goals  Decrease her back pain    Currently in Pain?  Yes    Pain Score  2     Pain Location  Back    Pain Orientation  Lower    Pain Descriptors / Indicators  Aching    Pain Type  Chronic pain    Pain Onset  More than a month ago        TREATMENT    Ther-ex  Lower lumbar rotations in hooklying x 10 each side with 3-5s hold at end range; R sidelying L hip abduction 2 x 10; L  sidelying L clams with manual resistance 2 x 10; Hooklying bridges 2 x 15  Manual Therapy  L hip inferior mobilizations with belt assist at 90 hip flexion for lumbar distraction 30s/bout x 2 bouts; L HS stretch 30s hold x 3 with added DF/PF for sciatic nerve gliding;  STM to L glute med and piriformis with multiple trigger points and positive reproduction of patient's pain.  Iontophoresis Iontophoresis patch applied to L SIJ region with 56m of dexamethasone. Pt provided printed information handout. Verbal education provided to patient regarding wear time, safety, and precautions.    Pt educated throughout session about proper posture and technique with exercises. Improved exercise technique, movement at target  joints, use of target muscles after min to mod verbal, visual, tactile cues.    Pt reports her pain decreased after last session, thus had pt complete similar interventions this session. Repeated STM today as pt has multiple trigger points in glut max/ed as well as piriformis. Attempted iontophoresis today as pt reports pain directly over SIJ. Pt reports approximately 60% improvement in symptoms since starting therapy. Pt will benefit from continued skilled PT interventions for decreased pain and improved QOL.                         PT Short Term Goals - 11/06/17 1407      PT SHORT TERM GOAL #1   Title  Pt will be independent with HEP in order to improve strength and decrease pain in order to improve pain-free function at home and work.     Baseline  10/1: Pt performing HEP at least 6 days/wk    Time  3    Period  Weeks    Status  Achieved        PT Long Term Goals - 11/06/17 1358      PT LONG TERM GOAL #1   Title  Pt will decrease worst pain as reported on NPRS by at least 3 points in order to demonstrate clinically significant reduction in pain.    Baseline  10/04/17: 5/10; 10/1: 6/10    Time  6    Period  Weeks    Status  On-going      PT LONG TERM GOAL #2   Title  Pt will increase strength of L hip and L knee by at least 1/2 MMT grade in order to demonstrate improvement in strength and function     Baseline  10/04/17: 4/5 hip extension, 4/5 knee flexion; 10/1: 4/5 hip extension, 4+/5 knee flexion    Time  6    Period  Weeks    Status  Partially Met      PT LONG TERM GOAL #3   Title  Pt will improve L hip IR ROM by at least 5 degrees to decrease lumbar forces when turning and decrease pain    Baseline  10/04/17: 30 degrees; 10/1: 33 deg    Time  6    Period  Weeks    Status  Partially Met      PT LONG TERM GOAL #4   Title  Pt will report at least 70% improvement in pain since starting therapy to demonstrate improved QOL    Baseline  10/1: 40%    Time   4    Period  Weeks    Status  New      PT LONG TERM GOAL #5   Title  Pt's mODI will improve to less than or  equal 10 to demonstrate decreased effect of pain on daily activities    Baseline  10/1: 20%    Time  4    Period  Weeks    Status  New            Plan - 11/22/17 1338    Clinical Impression Statement  Pt reports her pain decreased after last session, thus had pt complete similar interventions this session. Repeated STM today as pt has multiple trigger points in glut max/ed as well as piriformis. Attempted iontophoresis today as pt reports pain directly over SIJ. Pt reports approximately 60% improvement in symptoms since starting therapy. Pt will benefit from continued skilled PT interventions for decreased pain and improved QOL.    Rehab Potential  Fair    PT Frequency  2x / week    PT Duration  4 weeks    PT Treatment/Interventions  Cryotherapy;Aquatic Therapy;Electrical Stimulation;Canalith Repostioning;Iontophoresis 15m/ml Dexamethasone;Moist Heat;Traction;Ultrasound;DME Instruction;Gait training;Stair training;Functional mobility training;Therapeutic activities;Therapeutic exercise;Balance training;Neuromuscular re-education;Patient/family education;Manual techniques;Passive range of motion;Dry needling;Vestibular    PT Next Visit Plan  Progress HEP as approprriate to include strengthening, strengthening; consider additional testing to assess involvement of Left SIJ and possibly TDN to glut med/max    PT Home Exercise Plan  L HS stretch with rope, L piriformis stretch, double knee to chest, single knee to chest, lower lumbar rotations, bridges, lateral walking with RTB    Consulted and Agree with Plan of Care  Patient       Patient will benefit from skilled therapeutic intervention in order to improve the following deficits and impairments:  Decreased range of motion, Decreased strength, Pain  Visit Diagnosis: Chronic left-sided low back pain with left-sided  sciatica     Problem List There are no active problems to display for this patient.  JPhillips GroutPT, DPT, GCS  Huprich,Jason 11/23/2017, 10:40 AM  [place] [facility] [address] [address]Phone: [phone]  Fax:  [phone] Name: MBHAVYA ESCHETEMRN: [id number] of Birth: 818-Feb-1942

## 2017-11-27 ENCOUNTER — Ambulatory Visit: Payer: Medicare Other

## 2017-11-27 DIAGNOSIS — G8929 Other chronic pain: Secondary | ICD-10-CM

## 2017-11-27 DIAGNOSIS — M5442 Lumbago with sciatica, left side: Secondary | ICD-10-CM | POA: Diagnosis not present

## 2017-11-27 NOTE — Therapy (Signed)
[place] MAIN [facility] SERVICES [address] [place], [place], [address] Phone: [phone]   Fax:  [phone]  Physical Therapy Treatment  Patient Details  Name: Caitlin Roy MRN: [id number] Date of Birth: [birth year] Referring Provider (PT): Whitney Meeler   Encounter Date: 11/27/2017  PT End of Session - 11/27/17 1351    Visit Number  16    Number of Visits  21    Date for PT Re-Evaluation  12/04/17    Authorization Type  progress note 5/10    Authorization Time Period  start of reporting period 10/1    PT Start Time  1345    PT Stop Time  1430    PT Time Calculation (min)  45 min    Activity Tolerance  Patient tolerated treatment well    Behavior During Therapy  [facility] for tasks assessed/performed       Past Medical History:  Diagnosis Date  . Hyperlipidemia     Past Surgical History:  Procedure Laterality Date  . ABDOMINAL HYSTERECTOMY    . BREAST BIOPSY Bilateral 10+ years ago   Negative  . CORONARY ARTERY BYPASS GRAFT      There were no vitals filed for this visit.  Subjective Assessment - 11/27/17 1348    Subjective  Pt reports that she is doing well today. She denies any further pain since her last appointment. No specific questions or concerns at this time. Pt believes that she will be ready to discharge on Thursday.     Pertinent History  Pt was referred for acute on chronic left low back pain with radiation to left buttock and down the LLE to the level of the calf. Pt reports that her leg felt "tight" like she couldn't bend it and she was having numbness in her toes. She denies any trauma. Worst pain is 5/10, best is 0/10 and current pain is 3/10. Pain worsens as the day goes on. She plans to follow-up with her MD in 3 months. She was treated with 2 prednisone tapers and the second bout improved her symptoms in the lower left leg. She states that she still has some "crawling feelings" in her L lateral calf.  Her pain  originally began approximately 2 years ago (2017) without injury. She was evaluated by neurosurgery on 09/20/2017 at which time conservative management was recommended.     Diagnostic tests  MRI lumbar spine without contrast from [facility]: L5-S1 there is severe bilateral facet arthropathy with shallow disc bulge. Severe left-sided foraminal stenosis moderate right-sided foraminal stenosis. L4-5 severe bilateral facet arthropathy with shallow disc bulge. Borderline central stenosis. Moderate left and mild right foraminal stenosis. L3-4 shallow disc bulge with mild bilateral facet arthropathy. No central or foraminal stenosis. L2-3 left sided disc bulge without central or foraminal stenosis. L1-2 shallow disc bulge without central or foraminal stenosis.    Patient Stated Goals  Decrease her back pain    Currently in Pain?  No/denies    Pain Onset  --           TREATMENT    Ther-ex Lower lumbar rotations in hooklying x 10 each sidewith 3-5s hold at end range; R sidelying L hip abduction 2 x 10; R sidelying L clams with manual resistance 2 x 10; Hooklying bridges 2 x 10; L SLR x 10; Isometric clams with towel between knees 3s hold x 10;  Manual Therapy L hip inferior mobilizations with belt assist at 90 hip flexion for lumbar distraction  30s/bout x 2 bouts; L medial to lateral mobilizations with belt assist in hooklying position 30s/bout x 2 bouts; L long axis distraction with belt assist 30s x 2; L HS stretch 30s hold x 3 with added DF/PF for sciatic nerve gliding; STM to L glute med and piriformis with significantly less trigger points and minimal pain. Notable improvement in trigger points of piriformis during session;  Iontophoresis Iontophoresis patch applied to L SIJ region with 81m (467mmL) of dexamethasone. Pt reminded of wear time and skin inspection.   Pt educated throughout session about proper posture and technique with exercises. Improved exercise technique, movement at  target joints, use of target muscles after min to mod verbal, visual, tactile cues.    Pt reports no further pain since last session. STM to L glute med and piriformis with significantly less trigger points and minimal pain. Notable improvement in trigger points of piriformis as well as session progresses. Repeated iontophoresis today given positive response from last session. Repeat outcome measures, update goals, and discharge at next visit. Pt will benefit from continued skilled PT interventions for decreased pain and improved QOL.                       PT Short Term Goals - 11/06/17 1407      PT SHORT TERM GOAL #1   Title  Pt will be independent with HEP in order to improve strength and decrease pain in order to improve pain-free function at home and work.     Baseline  10/1: Pt performing HEP at least 6 days/wk    Time  3    Period  Weeks    Status  Achieved        PT Long Term Goals - 11/06/17 1358      PT LONG TERM GOAL #1   Title  Pt will decrease worst pain as reported on NPRS by at least 3 points in order to demonstrate clinically significant reduction in pain.    Baseline  10/04/17: 5/10; 10/1: 6/10    Time  6    Period  Weeks    Status  On-going      PT LONG TERM GOAL #2   Title  Pt will increase strength of L hip and L knee by at least 1/2 MMT grade in order to demonstrate improvement in strength and function     Baseline  10/04/17: 4/5 hip extension, 4/5 knee flexion; 10/1: 4/5 hip extension, 4+/5 knee flexion    Time  6    Period  Weeks    Status  Partially Met      PT LONG TERM GOAL #3   Title  Pt will improve L hip IR ROM by at least 5 degrees to decrease lumbar forces when turning and decrease pain    Baseline  10/04/17: 30 degrees; 10/1: 33 deg    Time  6    Period  Weeks    Status  Partially Met      PT LONG TERM GOAL #4   Title  Pt will report at least 70% improvement in pain since starting therapy to demonstrate improved QOL     Baseline  10/1: 40%    Time  4    Period  Weeks    Status  New      PT LONG TERM GOAL #5   Title  Pt's mODI will improve to less than or equal 10 to demonstrate decreased effect of pain on  daily activities    Baseline  10/1: 20%    Time  4    Period  Weeks    Status  New            Plan - 11/27/17 1352    Clinical Impression Statement  Pt reports no further pain since last session. STM to L glute med and piriformis with significantly less trigger points and minimal pain. Notable improvement in trigger points of piriformis as well as session progresses. Repeated iontophoresis today given positive response from last session. Repeat outcome measures, update goals, and discharge at next visit. Pt will benefit from continued skilled PT interventions for decreased pain and improved QOL.    Rehab Potential  Fair    PT Frequency  2x / week    PT Duration  4 weeks    PT Treatment/Interventions  Cryotherapy;Aquatic Therapy;Electrical Stimulation;Canalith Repostioning;Iontophoresis 80m/ml Dexamethasone;Moist Heat;Traction;Ultrasound;DME Instruction;Gait training;Stair training;Functional mobility training;Therapeutic activities;Therapeutic exercise;Balance training;Neuromuscular re-education;Patient/family education;Manual techniques;Passive range of motion;Dry needling;Vestibular    PT Next Visit Plan  Outcome measures, update goals, consider discharge if pt remains pain-free    PT Home Exercise Plan  L HS stretch with rope, L piriformis stretch, double knee to chest, single knee to chest, lower lumbar rotations, bridges, lateral walking with RTB    Consulted and Agree with Plan of Care  Patient       Patient will benefit from skilled therapeutic intervention in order to improve the following deficits and impairments:  Decreased range of motion, Decreased strength, Pain  Visit Diagnosis: Chronic left-sided low back pain with left-sided sciatica     Problem List There are no active  problems to display for this patient.  JPhillips GroutPT, DPT, GCS  Huprich,Jason 11/27/2017, 4:16 PM  CWare ShoalsMAIN [facility]rses[facility] CenterSERVICES [address] [address]Phone: [phone]  Fax:  [phone] Name: Caitlin DEAKINSMRN: [id number] of Birth: [birth year]

## 2017-11-29 ENCOUNTER — Ambulatory Visit: Payer: Medicare Other

## 2017-11-29 DIAGNOSIS — G8929 Other chronic pain: Secondary | ICD-10-CM

## 2017-11-29 DIAGNOSIS — M5442 Lumbago with sciatica, left side: Secondary | ICD-10-CM | POA: Diagnosis not present

## 2017-11-29 NOTE — Therapy (Addendum)
[place] MAIN [facility] SERVICES [address] [place], [place], [address] Phone: [phone]   Fax:  [phone]  Physical Therapy Treatment/Recertification/Progress Note  Dates of reporting period  11/06/17   to   11/30/17  Patient Details  Name: ANISSA ABBS MRN: [id number] Date of Birth: 07/02/1940 Referring Provider (PT): Whitney Meeler   Encounter Date: 11/29/2017  PT End of Session - 11/29/17 1354    Visit Number  17    Number of Visits  21    Date for PT Re-Evaluation  01/10/18    Authorization Type  progress note 6/10, next visit is 1/10    Authorization Time Period  start of reporting period 10/24    PT Start Time  1350    PT Stop Time  1430    PT Time Calculation (min)  40 min    Activity Tolerance  Patient tolerated treatment well    Behavior During Therapy  [facility] for tasks assessed/performed       Past Medical History:  Diagnosis Date  . Hyperlipidemia     Past Surgical History:  Procedure Laterality Date  . ABDOMINAL HYSTERECTOMY    . BREAST BIOPSY Bilateral 10+ years ago   Negative  . CORONARY ARTERY BYPASS GRAFT      There were no vitals filed for this visit.  Subjective Assessment - 11/29/17 1353    Subjective  Pt reports that she is doing well today however she is having some L low back tightness today. She is starting to think that she may need some more therapy sessions as she would like to achieve more pain relief.    Pertinent History  Pt was referred for acute on chronic left low back pain with radiation to left buttock and down the LLE to the level of the calf. Pt reports that her leg felt "tight" like she couldn't bend it and she was having numbness in her toes. She denies any trauma. Worst pain is 5/10, best is 0/10 and current pain is 3/10. Pain worsens as the day goes on. She plans to follow-up with her MD in 3 months. She was treated with 2 prednisone tapers and the second bout improved her symptoms in the  lower left leg. She states that she still has some "crawling feelings" in her L lateral calf.  Her pain originally began approximately 2 years ago (2017) without injury. She was evaluated by neurosurgery on 09/20/2017 at which time conservative management was recommended.     Diagnostic tests  MRI lumbar spine without contrast from [facility]: L5-S1 there is severe bilateral facet arthropathy with shallow disc bulge. Severe left-sided foraminal stenosis moderate right-sided foraminal stenosis. L4-5 severe bilateral facet arthropathy with shallow disc bulge. Borderline central stenosis. Moderate left and mild right foraminal stenosis. L3-4 shallow disc bulge with mild bilateral facet arthropathy. No central or foraminal stenosis. L2-3 left sided disc bulge without central or foraminal stenosis. L1-2 shallow disc bulge without central or foraminal stenosis.    Patient Stated Goals  Decrease her back pain    Currently in Pain?  Yes    Pain Score  4     Pain Location  Back    Pain Orientation  Lower    Pain Descriptors / Indicators  Tightness    Pain Type  Chronic pain    Pain Onset  More than a month ago            TREATMENT  Ther-ex NuStep L2 x 5 minutes  for warm-up during history; R sidelying L hip abduction2x 10; R sidelying Lclams with manual resistance 2x 10; Pt complete mODI (unbilled); Updated goals with patient and discussed plan of care;   Manual Therapy L1-L5 CPA, grade I-II, 30s/bout x 3 bouts/level Extensive STM to L glute medand piriformiswith significantly less trigger points and minimal pain. STM to L lumbar paraspinals today with notable trigger points and pain today.    Iontophoresis Iontophoresis patch applied to L SIJ region with 62m (445mmL) of dexamethasone. Pt reminded of wear time and skin inspection.   Pt educated throughout session about proper posture and technique with exercises. Improved exercise technique, movement at target joints, use of target  muscles after min to mod verbal, visual, tactile cues.   Pt reports some tightness today in her low back and believes that she needs some additional therapy sessions. Modified ODI is unchanged since last time it was updated and remains at 20%. Pt reports approximately 75% improvement in her back pain since starting therapy. Her hamstring strength has improved however her hip extension strength is still limited. L hip internal rotation has improved to 40%. Repeated iontophoresis today given positive response from last session. Will perform one more time at next session and then discontinue. Will seek approval from MD for an additional 6 weeks of therapy. Pt may not need the entire 6 weeks prior to discharge.Pt will benefit from continued skilled PT interventions for decreased pain and improved QOL.                      PT Short Term Goals - 11/29/17 1356      PT SHORT TERM GOAL #1   Title  Pt will be independent with HEP in order to improve strength and decrease pain in order to improve pain-free function at home and work.     Baseline  10/1: Pt performing HEP at least 6 days/wk    Time  3    Period  Weeks    Status  Achieved        PT Long Term Goals - 11/29/17 1356      PT LONG TERM GOAL #1   Title  Pt will decrease worst pain as reported on NPRS by at least 3 points in order to demonstrate clinically significant reduction in pain.    Baseline  10/04/17: 5/10; 10/1: 6/10; 11/29/17: 4/10    Time  6    Period  Weeks    Status  On-going    Target Date  01/10/18      PT LONG TERM GOAL #2   Title  Pt will increase strength of L hip and L knee by at least 1/2 MMT grade in order to demonstrate improvement in strength and function     Baseline  10/04/17: 4/5 hip extension, 4/5 knee flexion; 10/1: 4/5 hip extension, 4+/5 knee flexion; 11/29/17: 4/5 hip extension, 4+/5 knee flexion;    Time  6    Period  Weeks    Status  Partially Met    Target Date  01/10/18      PT  LONG TERM GOAL #3   Title  Pt will improve L hip IR ROM by at least 5 degrees to decrease lumbar forces when turning and decrease pain    Baseline  10/04/17: 30 degrees; 10/1: 33 deg; 11/29/17: 40 degrees    Time  6    Period  Weeks    Status  Achieved  PT LONG TERM GOAL #4   Title  Pt will report at least 70% improvement in pain since starting therapy to demonstrate improved QOL    Baseline  10/1: 40%; 11/29/17: at least 75% improved    Time  4    Period  Weeks    Status  Achieved      PT LONG TERM GOAL #5   Title  Pt's mODI will improve to less than or equal 10 to demonstrate decreased effect of pain on daily activities    Baseline  10/1: 20%; 11/29/17: 20%    Time  4    Period  Weeks    Status  On-going    Target Date  01/10/18            Plan - 11/29/17 1355    Clinical Impression Statement  Pt reports some tightness today in her low back and believes that she needs some additional therapy sessions. Modified ODI is unchanged since last time it was updated and remains at 20%. Pt reports approximately 75% improvement in her back pain since starting therapy. Her hamstring strength has improved however her hip extension strength is still limited. L hip internal rotation has improved to 40%. Repeated iontophoresis today given positive response from last session. Will perform one more time at next session and then discontinue. Will seek approval from MD for an additional 6 weeks of therapy. Pt may not need the entire 6 weeks prior to discharge.Pt will benefit from continued skilled PT interventions for decreased pain and improved QOL.     Rehab Potential  Fair    PT Frequency  2x / week    PT Duration  6 weeks    PT Treatment/Interventions  Cryotherapy;Aquatic Therapy;Electrical Stimulation;Canalith Repostioning;Iontophoresis 36m/ml Dexamethasone;Moist Heat;Traction;Ultrasound;DME Instruction;Gait training;Stair training;Functional mobility training;Therapeutic  activities;Therapeutic exercise;Balance training;Neuromuscular re-education;Patient/family education;Manual techniques;Passive range of motion;Dry needling;Vestibular    PT Next Visit Plan  Continue with STM, spinal mobilizations, and strengthening    PT Home Exercise Plan  L HS stretch with rope, L piriformis stretch, double knee to chest, single knee to chest, lower lumbar rotations, bridges, lateral walking with RTB    Consulted and Agree with Plan of Care  Patient       Patient will benefit from skilled therapeutic intervention in order to improve the following deficits and impairments:  Decreased range of motion, Decreased strength, Pain  Visit Diagnosis: Chronic left-sided low back pain with left-sided sciatica     Problem List There are no active problems to display for this patient.  JPhillips GroutPT, DPT, GCS  Huprich,Jason 11/29/2017, 4:31 PM  CN[place]MAIN RSutter [facility] CenterSERVICES [address] [address]Phone: [phone]  Fax:  [phone] Name: MARAH AROMRN: [id number] of Birth: [birth year]

## 2017-12-05 ENCOUNTER — Ambulatory Visit: Payer: Medicare Other

## 2017-12-05 DIAGNOSIS — M5442 Lumbago with sciatica, left side: Secondary | ICD-10-CM | POA: Diagnosis not present

## 2017-12-05 DIAGNOSIS — G8929 Other chronic pain: Secondary | ICD-10-CM

## 2017-12-05 NOTE — Therapy (Signed)
Date  01/10/18            Plan - 12/05/17 0925    Clinical Impression Statement  Pt reports significant improvement in low back and L hip pain upon arrival today. She is likely close to discharge but her symptoms have fluctuated somewhat between sessions. Pt encouraged to continue HEP and follow-up as scheduled. Will continue to progress functional strengthening and move away from passive modalities. Pt will benefit from continued skilled PT interventions for decreased pain and improved QOL.    Rehab Potential  Fair    PT Frequency  2x / week    PT Duration  6 weeks    PT Treatment/Interventions  Cryotherapy;Aquatic Therapy;Electrical Stimulation;Canalith Repostioning;Iontophoresis 34m/ml Dexamethasone;Moist Heat;Traction;Ultrasound;DME Instruction;Gait training;Stair training;Functional mobility training;Therapeutic activities;Therapeutic exercise;Balance training;Neuromuscular re-education;Patient/family education;Manual techniques;Passive range of motion;Dry needling;Vestibular    PT Next Visit Plan  Continue with STM, spinal mobilizations, and strengthening    PT Home Exercise Plan  L HS stretch with rope, L piriformis stretch, double knee to chest, single knee to chest, lower lumbar rotations, bridges, lateral walking with RTB    Consulted and Agree with Plan of Care  Patient       Patient will benefit from skilled therapeutic intervention in order to improve the following deficits and impairments:  Decreased range of motion, Decreased strength, Pain  Visit Diagnosis: Chronic left-sided low back pain with left-sided sciatica     Problem List There are no active problems to display for this patient.  JPhillips GroutPT, DPT, GCS  Caitlin Roy 12/05/2017, 10:51 AM  C[place]MAIN RMet[facility] CenterSERVICES [address]  [address]Phone: [phone]  Fax:  [phone] Name: Caitlin GOODLOWMRN: [id number] of Birth: [birth year]  [place] MAIN [facility] SERVICES [address] [place], [place], [address] Phone: [phone]   Fax:  [phone]  Physical Therapy Treatment  Patient Details  Name: Caitlin Roy MRN: [id number] Date of Birth: 1940/07/19 Referring Provider (PT): Whitney Meeler   Encounter Date: 12/05/2017  PT End of Session - 12/05/17 0857    Visit Number  18    Number of Visits  21    Date for PT Re-Evaluation  01/10/18    Authorization Type  progress note 1/10    Authorization Time Period  start of reporting period 10/24    Activity Tolerance  Patient tolerated treatment well    Behavior During Therapy  [facility] for tasks assessed/performed       Past Medical History:  Diagnosis Date  . Hyperlipidemia     Past Surgical History:  Procedure Laterality Date  . ABDOMINAL HYSTERECTOMY    . BREAST BIOPSY Bilateral 10+ years ago   Negative  . CORONARY ARTERY BYPASS GRAFT      There were no vitals filed for this visit.  Subjective Assessment - 12/05/17 0854    Subjective  Pt reports that she worked out in the yard yesterday and is having some R upper trap soreness today from trimming the hedges. No lower back or hip pain today    Pertinent History  Pt was referred for acute on chronic left low back pain with radiation to left buttock and down the LLE to the level of the calf. Pt reports that her leg felt "tight" like she couldn't bend it and she was having numbness in her toes. She denies any trauma. Worst pain is 5/10, best is 0/10 and current pain is 3/10. Pain worsens as the day goes on. She plans to follow-up with her MD in 3 months. She was treated with 2 prednisone tapers and the second bout improved her symptoms in the lower left leg. She states that she still has some "crawling feelings" in her L lateral calf.  Her pain originally began approximately 2 years ago (2017) without injury. She was evaluated by neurosurgery on 09/20/2017 at which time conservative  management was recommended.     Diagnostic tests  MRI lumbar spine without contrast from [facility]: L5-S1 there is severe bilateral facet arthropathy with shallow disc bulge. Severe left-sided foraminal stenosis moderate right-sided foraminal stenosis. L4-5 severe bilateral facet arthropathy with shallow disc bulge. Borderline central stenosis. Moderate left and mild right foraminal stenosis. L3-4 shallow disc bulge with mild bilateral facet arthropathy. No central or foraminal stenosis. L2-3 left sided disc bulge without central or foraminal stenosis. L1-2 shallow disc bulge without central or foraminal stenosis.    Patient Stated Goals  Decrease her back pain    Currently in Pain?  Yes    Pain Score  4     Pain Location  Back    Pain Orientation  Right;Upper    Pain Descriptors / Indicators  Tiring    Pain Type  Acute pain    Pain Onset  Yesterday    Pain Frequency  Intermittent           TREATMENT   Ther-ex NuStep L2 x 5 minutes for warm-up during history; R sidelying L hip abduction2x 10; Rsidelying Lclams with manual resistance 2x 10; Hooklying bridges 2 x 10; Sit to stand without UE support x 10, with 2kg weighted ball x 10;   Manual Therapy Extensive STM to L glute medand piriformiswithsignificantly less trigger points and  Date  01/10/18            Plan - 12/05/17 0925    Clinical Impression Statement  Pt reports significant improvement in low back and L hip pain upon arrival today. She is likely close to discharge but her symptoms have fluctuated somewhat between sessions. Pt encouraged to continue HEP and follow-up as scheduled. Will continue to progress functional strengthening and move away from passive modalities. Pt will benefit from continued skilled PT interventions for decreased pain and improved QOL.    Rehab Potential  Fair    PT Frequency  2x / week    PT Duration  6 weeks    PT Treatment/Interventions  Cryotherapy;Aquatic Therapy;Electrical Stimulation;Canalith Repostioning;Iontophoresis 34m/ml Dexamethasone;Moist Heat;Traction;Ultrasound;DME Instruction;Gait training;Stair training;Functional mobility training;Therapeutic activities;Therapeutic exercise;Balance training;Neuromuscular re-education;Patient/family education;Manual techniques;Passive range of motion;Dry needling;Vestibular    PT Next Visit Plan  Continue with STM, spinal mobilizations, and strengthening    PT Home Exercise Plan  L HS stretch with rope, L piriformis stretch, double knee to chest, single knee to chest, lower lumbar rotations, bridges, lateral walking with RTB    Consulted and Agree with Plan of Care  Patient       Patient will benefit from skilled therapeutic intervention in order to improve the following deficits and impairments:  Decreased range of motion, Decreased strength, Pain  Visit Diagnosis: Chronic left-sided low back pain with left-sided sciatica     Problem List There are no active problems to display for this patient.  JPhillips GroutPT, DPT, GCS  Caitlin Roy 12/05/2017, 10:51 AM  [place]MAIN RMet[facility] CenterSERVICES [address]  [address]Phone: [phone]  Fax:  [phone] Name: Caitlin GOODLOWMRN: [id number] of Birth: [birth year]

## 2017-12-11 ENCOUNTER — Ambulatory Visit: Payer: Medicare Other | Admitting: Physical Therapy

## 2017-12-11 ENCOUNTER — Ambulatory Visit: Payer: Medicare Other | Attending: Family Medicine

## 2017-12-11 DIAGNOSIS — G8929 Other chronic pain: Secondary | ICD-10-CM | POA: Insufficient documentation

## 2017-12-11 DIAGNOSIS — M5442 Lumbago with sciatica, left side: Secondary | ICD-10-CM | POA: Diagnosis present

## 2017-12-11 NOTE — Therapy (Signed)
[place] [facility] SERVICES [address] [place], [place], [address] Phone: [phone]   Fax:  [phone]  Physical Therapy Treatment/Discharge Summary  Dates of reporting period  11/29/17   to  12/11/17     Patient Details  Name: Caitlin Roy MRN: [id number] Date of Birth: 1940/08/02 Referring Provider (PT): Whitney Meeler   Encounter Date: 12/11/2017  PT End of Session - 12/11/17 1300    Visit Number  19    Number of Visits  21    Date for PT Re-Evaluation  01/10/18    Authorization Type  progress note 2/10    Authorization Time Period  start of reporting period 10/24    PT Start Time  1305    PT Stop Time  1345    PT Time Calculation (min)  40 min    Activity Tolerance  Patient tolerated treatment well    Behavior During Therapy  [facility] for tasks assessed/performed       Past Medical History:  Diagnosis Date  . Hyperlipidemia     Past Surgical History:  Procedure Laterality Date  . ABDOMINAL HYSTERECTOMY    . BREAST BIOPSY Bilateral 10+ years ago   Negative  . CORONARY ARTERY BYPASS GRAFT      There were no vitals filed for this visit.  Subjective Assessment - 12/11/17 1259    Subjective  Pt denies any L hip pain with standing or walking today. She continues to report some pain with deep palpation but otherwise no pain. She is not limited in her activity at all    Pertinent History  Pt was referred for acute on chronic left low back pain with radiation to left buttock and down the LLE to the level of the calf. Pt reports that her leg felt "tight" like she couldn't bend it and she was having numbness in her toes. She denies any trauma. Worst pain is 5/10, best is 0/10 and current pain is 3/10. Pain worsens as the day goes on. She plans to follow-up with her MD in 3 months. She was treated with 2 prednisone tapers and the second bout improved her symptoms in the lower left leg. She states that she still has some "crawling feelings"  in her L lateral calf.  Her pain originally began approximately 2 years ago (2017) without injury. She was evaluated by neurosurgery on 09/20/2017 at which time conservative management was recommended.     Limitations  Standing;Walking;Sitting    Diagnostic tests  MRI lumbar spine without contrast from [facility]: L5-S1 there is severe bilateral facet arthropathy with shallow disc bulge. Severe left-sided foraminal stenosis moderate right-sided foraminal stenosis. L4-5 severe bilateral facet arthropathy with shallow disc bulge. Borderline central stenosis. Moderate left and mild right foraminal stenosis. L3-4 shallow disc bulge with mild bilateral facet arthropathy. No central or foraminal stenosis. L2-3 left sided disc bulge without central or foraminal stenosis. L1-2 shallow disc bulge without central or foraminal stenosis.    Patient Stated Goals  Decrease her back pain    Currently in Pain?  No/denies         TREATMENT   Ther-ex NuStep L2 x 5 minutes for warm-up during history; R sidelying L hip abduction2x 15; Rsidelying Lclams with manual resistance 2x 15; Hooklying L SLR 2 x 15; Hooklying bridges 2 x 15; Hooklying lumbar rotation with 3s hold at end range x 10 each direction; Sit to stand without UE support with overhead weighted bar press 2 x 10;  Quantum leg press 60# 2 x 20 bilateral; Octane HIIT alternating L10 x 30s with L5 x 60s, 5 minutes total followed by 2 minute cool down. Pt monitored during exercise for fatigue and to ensure appropriate challenge; HEP review and discharge instructions.    Pt educated throughout session about proper posture and technique with exercises. Improved exercise technique, movement at target joints, use of target muscles after min to mod verbal, visual, tactile cues.   Pt reports significant improvement in low back and L hip pain since starting therapy. She reports at least 75% improvement overall and 100% improvement with some activities.  No further LLE numbness reported. Her worst pain has decreased significantly since starting therapy and she is consistent with her HEP. Pt will be discharged at this time at patient's request and with the full agreement of PT. She was instructed to continue her HEP and return for additional therapy if she doesn't continue to improve or if her pain worsens.                       PT Education - 12/11/17 1305    Education Details  HEP, discharge    Person(s) Educated  Patient    Methods  Explanation    Comprehension  Verbalized understanding       PT Short Term Goals - 12/11/17 1306      PT SHORT TERM GOAL #1   Title  Pt will be independent with HEP in order to improve strength and decrease pain in order to improve pain-free function at home and work.     Baseline  10/1: Pt performing HEP at least 6 days/wk    Time  3    Period  Weeks    Status  Achieved        PT Long Term Goals - 12/11/17 1306      PT LONG TERM GOAL #1   Title  Pt will decrease worst pain as reported on NPRS by at least 3 points in order to demonstrate clinically significant reduction in pain.    Baseline  10/04/17: 5/10; 10/1: 6/10; 11/29/17: 4/10; 12/11/17: 3/10;    Time  6    Period  Weeks    Status  On-going    Target Date  01/10/18      PT LONG TERM GOAL #2   Title  Pt will increase strength of L hip and L knee by at least 1/2 MMT grade in order to demonstrate improvement in strength and function     Baseline  10/04/17: 4/5 hip extension, 4/5 knee flexion; 10/1: 4/5 hip extension, 4+/5 knee flexion; 11/29/17: 4/5 hip extension, 4+/5 knee flexion;    Time  6    Period  Weeks    Status  Partially Met      PT LONG TERM GOAL #3   Title  Pt will improve L hip IR ROM by at least 5 degrees to decrease lumbar forces when turning and decrease pain    Baseline  10/04/17: 30 degrees; 10/1: 33 deg; 11/29/17: 40 degrees    Time  6    Period  Weeks    Status  Achieved      PT LONG TERM GOAL #4    Title  Pt will report at least 70% improvement in pain since starting therapy to demonstrate improved QOL    Baseline  10/1: 40%; 11/29/17: at least 75% improved; 12/11/17: 75%     Time  4  Period  Weeks    Status  Achieved      PT LONG TERM GOAL #5   Title  Pt's mODI will improve to less than or equal 10 to demonstrate decreased effect of pain on daily activities    Baseline  10/1: 20%; 11/29/17: 20%    Time  4    Period  Weeks    Status  On-going            Plan - 12/11/17 1300    Clinical Impression Statement  Pt reports significant improvement in low back and L hip pain since starting therapy. She reports at least 75% improvement overall and 100% improvement with some activities. No further LLE numbness reported. Her worst pain has decreased significantly since starting therapy and she is consistent with her HEP. Pt will be discharged at this time at patient's request and with the full agreement of PT. She was instructed to continue her HEP and return for additional therapy if she doesn't continue to improve or if her pain worsens.     Clinical Decision Making  Moderate    Rehab Potential  Fair    PT Frequency  2x / week    PT Duration  6 weeks    PT Treatment/Interventions  Cryotherapy;Aquatic Therapy;Electrical Stimulation;Canalith Repostioning;Iontophoresis 81m/ml Dexamethasone;Moist Heat;Traction;Ultrasound;DME Instruction;Gait training;Stair training;Functional mobility training;Therapeutic activities;Therapeutic exercise;Balance training;Neuromuscular re-education;Patient/family education;Manual techniques;Passive range of motion;Dry needling;Vestibular    PT Next Visit Plan  Discharge    PT Home Exercise Plan  L HS stretch with rope, L piriformis stretch, double knee to chest, single knee to chest, lower lumbar rotations, bridges, lateral walking with RTB    Consulted and Agree with Plan of Care  Patient       Patient will benefit from skilled therapeutic intervention in  order to improve the following deficits and impairments:  Decreased range of motion, Decreased strength, Pain  Visit Diagnosis: Chronic left-sided low back pain with left-sided sciatica     Problem List There are no active problems to display for this patient.  JPhillips GroutPT, DPT, GCS  Huprich,Jason 12/11/2017, 3:39 PM  [place] [facility] [facility] [address] [address]Phone: [phone]  Fax:  [phone] Name: Caitlin HELLENBRANDMRN: [id number] of Birth: [birth year]

## 2017-12-13 ENCOUNTER — Ambulatory Visit: Payer: Medicare Other | Admitting: Physical Therapy

## 2017-12-17 ENCOUNTER — Ambulatory Visit: Payer: Medicare Other | Admitting: Physical Therapy

## 2017-12-19 ENCOUNTER — Ambulatory Visit: Payer: Medicare Other | Admitting: Physical Therapy

## 2017-12-24 ENCOUNTER — Ambulatory Visit: Payer: Medicare Other | Admitting: Physical Therapy

## 2017-12-26 ENCOUNTER — Ambulatory Visit: Payer: Medicare Other | Admitting: Physical Therapy

## 2018-04-01 ENCOUNTER — Other Ambulatory Visit: Payer: Self-pay | Admitting: Internal Medicine

## 2018-04-01 DIAGNOSIS — Z1231 Encounter for screening mammogram for malignant neoplasm of breast: Secondary | ICD-10-CM

## 2018-07-08 ENCOUNTER — Ambulatory Visit
Admission: RE | Admit: 2018-07-08 | Discharge: 2018-07-08 | Disposition: A | Discharge status: Home / Self Care | Payer: Medicare Other | Source: Ambulatory Visit | Attending: Internal Medicine | Admitting: Internal Medicine

## 2018-07-08 ENCOUNTER — Other Ambulatory Visit: Payer: Self-pay

## 2018-07-08 DIAGNOSIS — Z1231 Encounter for screening mammogram for malignant neoplasm of breast: Secondary | ICD-10-CM | POA: Diagnosis present

## 2019-04-14 ENCOUNTER — Other Ambulatory Visit: Payer: Self-pay | Admitting: Internal Medicine

## 2019-04-14 DIAGNOSIS — Z1231 Encounter for screening mammogram for malignant neoplasm of breast: Secondary | ICD-10-CM

## 2019-07-15 ENCOUNTER — Other Ambulatory Visit: Payer: Self-pay

## 2019-07-15 ENCOUNTER — Ambulatory Visit (INDEPENDENT_AMBULATORY_CARE_PROVIDER_SITE_OTHER): Payer: Medicare Other | Admitting: Dermatology

## 2019-07-15 DIAGNOSIS — L820 Inflamed seborrheic keratosis: Secondary | ICD-10-CM | POA: Diagnosis not present

## 2019-07-15 DIAGNOSIS — Z872 Personal history of diseases of the skin and subcutaneous tissue: Secondary | ICD-10-CM | POA: Diagnosis not present

## 2019-07-15 DIAGNOSIS — D485 Neoplasm of uncertain behavior of skin: Secondary | ICD-10-CM

## 2019-07-15 DIAGNOSIS — L578 Other skin changes due to chronic exposure to nonionizing radiation: Secondary | ICD-10-CM | POA: Diagnosis not present

## 2019-07-15 DIAGNOSIS — C4491 Basal cell carcinoma of skin, unspecified: Secondary | ICD-10-CM

## 2019-07-15 DIAGNOSIS — Z85828 Personal history of other malignant neoplasm of skin: Secondary | ICD-10-CM | POA: Diagnosis not present

## 2019-07-15 HISTORY — DX: Basal cell carcinoma of skin, unspecified: C44.91

## 2019-07-15 NOTE — Patient Instructions (Signed)
Wound Care Instructions ° °1. Cleanse wound gently with soap and water once a day then pat dry with clean gauze. Apply a thing coat of Petrolatum (petroleum jelly, "Vaseline") over the wound (unless you have an allergy to this). We recommend that you use a new, sterile tube of Vaseline. Do not pick or remove scabs. Do not remove the yellow or white "healing tissue" from the base of the wound. ° °2. Cover the wound with fresh, clean, nonstick gauze and secure with paper tape. You may use Band-Aids in place of gauze and tape if the would is small enough, but would recommend trimming much of the tape off as there is often too much. Sometimes Band-Aids can irritate the skin. ° °3. You should call the office for your biopsy report after 1 week if you have not already been contacted. ° °4. If you experience any problems, such as abnormal amounts of bleeding, swelling, significant bruising, significant pain, or evidence of infection, please call the office immediately. ° °Cryotherapy Aftercare ° °• Wash gently with soap and water everyday.   °• Apply Vaseline and Band-Aid daily until healed. ° ° °

## 2019-07-15 NOTE — Progress Notes (Signed)
   Follow-Up Visit   Subjective  Caitlin Roy is a 79 y.o. female who presents for the following: Follow-up (AKs). Patient still has a scaly spot on her left nasal root. Was frozen last visit.  She had BCC treated in same area 1/20.  Other spots treated last visit have cleared. She has a h/o multiple BCCs.  The following portions of the chart were reviewed this encounter and updated as appropriate:      Review of Systems:  No other skin or systemic complaints except as noted in HPI or Assessment and Plan.  Objective  Well appearing patient in no apparent distress; mood and affect are within normal limits.  A focused examination was performed including face. Relevant physical exam findings are noted in the Assessment and Plan.  Objective  Nose: Clear today.  Objective  Left Mid Nasolabial x 1, L forehead x 1 (2): Erythematous keratotic or waxy stuck-on papule or plaque.   Objective  Left Nasal Root: 6.0 x 3.88mm pink scaly macule ant to scar      Assessment & Plan   Actinic Damage - diffuse scaly erythematous macules with underlying dyspigmentation - Recommend daily broad spectrum sunscreen SPF 30+ to sun-exposed areas, reapply every 2 hours as needed.  - Call for new or changing lesions. History of Basal Cell Carcinoma of the Skin - No evidence of recurrence today - Recommend regular full body skin exams - Recommend daily broad spectrum sunscreen SPF 30+ to sun-exposed areas, reapply every 2 hours as needed.  - Call if any new or changing lesions are noted between office visits  History of actinic keratosis Nose  Clear. Observe for recurrence. Call clinic for new or changing lesions.  Recommend regular skin exams, daily broad-spectrum spf 30+ sunscreen use, and photoprotection.     Inflamed seborrheic keratosis (2) Left Mid Nasolabial x 1, L forehead x 1  Destruction of lesion - Left Mid Nasolabial x 1, L forehead x 1  Destruction method: cryotherapy     Informed consent: discussed and consent obtained   Lesion destroyed using liquid nitrogen: Yes   Region frozen until ice ball extended beyond lesion: Yes   Outcome: patient tolerated procedure well with no complications   Post-procedure details: wound care instructions given    Neoplasm of uncertain behavior of skin Left Nasal Root  Skin / nail biopsy Type of biopsy: tangential   Informed consent: discussed and consent obtained   Patient was prepped and draped in usual sterile fashion: Area prepped with alcohol. Anesthesia: the lesion was anesthetized in a standard fashion   Anesthetic:  1% lidocaine w/ epinephrine 1-100,000 buffered w/ 8.4% NaHCO3 Instrument used: flexible razor blade   Hemostasis achieved with: pressure, aluminum chloride and electrodesiccation   Outcome: patient tolerated procedure well   Post-procedure details: wound care instructions given   Post-procedure details comment:  Ointment and small bandage applied  Specimen 1 - Surgical pathology Differential Diagnosis: r/o Recurrent BCC Check Margins: No 6.0 x 3.83mm pink scaly macule ant to scar  If BCC, will send for [facility] with Dr Lacinda Axon.  Return in about 6 months (around 01/14/2020) for TBSE.   IJamesetta Orleans, CMA, am acting as scribe for Brendolyn Patty, MD .  Documentation: I have reviewed the above documentation for accuracy and completeness, and I agree with the above.  Brendolyn Patty MD

## 2019-07-17 ENCOUNTER — Telehealth: Payer: Self-pay

## 2019-07-17 NOTE — Telephone Encounter (Signed)
Lft pt msg to call for bx results/sh °

## 2019-07-17 NOTE — Telephone Encounter (Signed)
-----   Message from Brendolyn Patty, MD sent at 07/16/2019  5:25 PM EDT ----- Skin , left nasal root SUPERFICIAL BASAL CELL CARCINOMA, ULCERATED  BCC- recurrent.  Needs Mohs with Dr. Lacinda Axon at [facility].

## 2019-07-21 ENCOUNTER — Telehealth: Payer: Self-pay

## 2019-07-21 DIAGNOSIS — C44311 Basal cell carcinoma of skin of nose: Secondary | ICD-10-CM

## 2019-07-21 NOTE — Telephone Encounter (Signed)
Advised pt of bx results.  Advised we would put referral in to Dr. Lacinda Axon for [facility].Mariana Kaufman

## 2019-07-21 NOTE — Telephone Encounter (Signed)
-----   Message from Brendolyn Patty, MD sent at 07/16/2019  5:25 PM EDT ----- Skin , left nasal root SUPERFICIAL BASAL CELL CARCINOMA, ULCERATED  BCC- recurrent.  Needs Mohs with Dr. Lacinda Axon at [facility].

## 2019-11-05 ENCOUNTER — Other Ambulatory Visit: Payer: Self-pay

## 2019-11-05 ENCOUNTER — Ambulatory Visit
Admission: RE | Admit: 2019-11-05 | Discharge: 2019-11-05 | Disposition: A | Discharge status: Home / Self Care | Payer: Medicare Other | Source: Ambulatory Visit | Attending: Internal Medicine | Admitting: Internal Medicine

## 2019-11-05 DIAGNOSIS — Z1231 Encounter for screening mammogram for malignant neoplasm of breast: Secondary | ICD-10-CM | POA: Diagnosis not present

## 2020-01-20 ENCOUNTER — Ambulatory Visit (INDEPENDENT_AMBULATORY_CARE_PROVIDER_SITE_OTHER): Payer: Medicare Other | Admitting: Dermatology

## 2020-01-20 ENCOUNTER — Other Ambulatory Visit: Payer: Self-pay

## 2020-01-20 ENCOUNTER — Encounter: Payer: Self-pay | Admitting: Dermatology

## 2020-01-20 DIAGNOSIS — L820 Inflamed seborrheic keratosis: Secondary | ICD-10-CM

## 2020-01-20 DIAGNOSIS — Z85828 Personal history of other malignant neoplasm of skin: Secondary | ICD-10-CM

## 2020-01-20 DIAGNOSIS — L814 Other melanin hyperpigmentation: Secondary | ICD-10-CM

## 2020-01-20 DIAGNOSIS — L821 Other seborrheic keratosis: Secondary | ICD-10-CM | POA: Diagnosis not present

## 2020-01-20 DIAGNOSIS — L570 Actinic keratosis: Secondary | ICD-10-CM

## 2020-01-20 DIAGNOSIS — L578 Other skin changes due to chronic exposure to nonionizing radiation: Secondary | ICD-10-CM

## 2020-01-20 DIAGNOSIS — D229 Melanocytic nevi, unspecified: Secondary | ICD-10-CM

## 2020-01-20 DIAGNOSIS — Z1283 Encounter for screening for malignant neoplasm of skin: Secondary | ICD-10-CM

## 2020-01-20 DIAGNOSIS — D1800 Hemangioma unspecified site: Secondary | ICD-10-CM

## 2020-01-20 MED ORDER — MOMETASONE FUROATE 0.1 % EX CREA: 1.0000 "application " | TOPICAL_CREAM | Freq: Every day | CUTANEOUS | 2 refills | Status: DC | PRN

## 2020-01-20 NOTE — Progress Notes (Addendum)
Follow-Up Visit   Subjective  Caitlin Roy is a 79 y.o. female who presents for the following: TBSE (6 mo total body. Hx of multiple BCC. F/u post [facility] surgery for BCC left nasal root. ).  Gets pink spots on arms/legs.  Itchy growths lower abdomen.    The following portions of the chart were reviewed this encounter and updated as appropriate:      Review of Systems: No other skin or systemic complaints except as noted in HPI or Assessment and Plan.   Objective  Well appearing patient in no apparent distress; mood and affect are within normal limits.  A full examination was performed including scalp, head, eyes, ears, nose, lips, neck, chest, axillae, abdomen, back, buttocks, bilateral upper extremities, bilateral lower extremities, hands, feet, fingers, toes, fingernails, and toenails. All findings within normal limits unless otherwise noted below.  Objective  left nasal root: Concave scarred skin graft, clear  Objective  left upper eyelid at margin, suprapubic x 2: Pink scaly macules/small patches - right forearm, right and left pretibia Waxy papules with erythema suprapubic area Waxy flesh papule left upper eyelid at margin   Objective  right medial shoulder x 1, right malar cheek x 1, nose x 3, left cheek x 1, left posterior shoulder x 1 (7): Erythematous thin papules/macules with gritty scale.   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 Observe for recurrence. Call clinic for new or changing lesions.  Recommend regular skin exams, daily broad-spectrum spf 30+ sunscreen use, and photoprotection.     Inflamed seborrheic keratosis (2) left upper eyelid at margin; suprapubic x 2  ISK vs Psoriasis  Start mometasone cream to aas arms/legs. Apply to affected areas 1-2 times per day.   Prior to procedure, discussed risks of blister formation, small wound, skin dyspigmentation, or rare scar following  cryotherapy.    Destruction of lesion - suprapubic x 2  Destruction method: cryotherapy   Informed consent: discussed and consent obtained   Lesion destroyed using liquid nitrogen: Yes   Region frozen until ice ball extended beyond lesion: Yes   Outcome: patient tolerated procedure well with no complications   Post-procedure details: wound care instructions given    mometasone (ELOCON) 0.1 % cream - suprapubic x 2  Epidermal / dermal shaving - left upper eyelid at margin  Informed consent: discussed and consent obtained   Patient was prepped and draped in usual sterile fashion: Area prepped with alcohol. Anesthesia: the lesion was anesthetized in a standard fashion   Anesthetic:  1% lidocaine w/ epinephrine 1-100,000 buffered w/ 8.4% NaHCO3 Instrument used: scissors   Hemostasis achieved with: pressure and electrodesiccation   Outcome: patient tolerated procedure well   Post-procedure details: wound care instructions given   Additional details:  Vaseline applied  AK (actinic keratosis) (7) right medial shoulder x 1, right malar cheek x 1, nose x 3, left cheek x 1, left posterior shoulder x 1  Prior to procedure, discussed risks of blister formation, small wound, skin dyspigmentation, or rare scar following cryotherapy.    Destruction of lesion - right medial shoulder x 1, right malar cheek x 1, nose x 3, left cheek x 1, left posterior shoulder x 1  Destruction method: cryotherapy   Informed consent: discussed and consent obtained   Lesion destroyed using liquid nitrogen: Yes   Region frozen until ice ball extended beyond lesion: Yes   Outcome: patient tolerated procedure well with  no complications   Post-procedure details: wound care instructions given     Lentigines - Scattered tan macules - Discussed due to sun exposure - Benign, observe - Call for any changes  Seborrheic Keratoses - Stuck-on, waxy, tan-brown papules and plaques  - Discussed benign etiology and  prognosis. - Observe - Call for any changes  Melanocytic Nevi - Tan-brown and/or pink-flesh-colored symmetric macules and papules - Benign appearing on exam today - Observation - Call clinic for new or changing moles - Recommend daily use of broad spectrum spf 30+ sunscreen to sun-exposed areas.   Hemangiomas - Red papules - Discussed benign nature - Observe - Call for any changes  Actinic Damage - Severe, chronic, secondary to cumulative UV radiation exposure over time - diffuse scaly erythematous macules and papules with underlying dyspigmentation - Discussed Prescription "Field Treatment" for Severe, Chronic Confluent Actinic Changes with Pre-Cancerous Actinic Keratoses Field treatment involves treatment of an entire area of skin that has confluent Actinic Changes (Sun/ Ultraviolet light damage) and PreCancerous Actinic Keratoses by method of PhotoDynamic Therapy (PDT) and/or prescription Topical Chemotherapy agents such as 5-fluorouracil, 5-fluorouracil/calcipotriene, and/or imiquimod.  The purpose is to decrease the number of clinically evident and subclinical PreCancerous lesions to prevent progression to development of skin cancer by chemically destroying early precancer changes that may or may not be visible.  It has been shown to reduce the risk of developing skin cancer in the treated area. As a result of treatment, redness, scaling, crusting, and open sores may occur during treatment course. One or more than one of these methods may be used and may have to be used several times to control, suppress and eliminate the PreCancerous changes. Discussed treatment course, expected reaction, and possible side effects. - Recommend daily broad spectrum sunscreen SPF 30+ to sun-exposed areas, reapply every 2 hours as needed.  - Call for new or changing lesions. - Will schedule 2 photodynamic therapy treatments to the face one month apart.   Skin cancer screening performed today.  Return  in about 1 month (around 02/20/2020) for PDT face, 2 mo PDT face, 3 mo f/u with Dr. Nicole Kindred.   I, Harriett Sine, CMA, am acting as scribe for Brendolyn Patty, MD.  Documentation: I have reviewed the above documentation for accuracy and completeness, and I agree with the above.  Brendolyn Patty MD

## 2020-01-20 NOTE — Patient Instructions (Signed)

## 2020-02-16 ENCOUNTER — Other Ambulatory Visit: Payer: Self-pay

## 2020-02-16 ENCOUNTER — Ambulatory Visit (INDEPENDENT_AMBULATORY_CARE_PROVIDER_SITE_OTHER): Payer: Medicare Other

## 2020-02-16 DIAGNOSIS — L570 Actinic keratosis: Secondary | ICD-10-CM

## 2020-02-16 MED ORDER — AMINOLEVULINIC ACID HCL 20 % EX SOLR
1.0000 "application " | Freq: Once | CUTANEOUS | Status: AC
  Administered 2020-02-16: 354 mg via TOPICAL

## 2020-02-16 NOTE — Progress Notes (Signed)

## 2020-02-16 NOTE — Patient Instructions (Signed)

## 2020-03-25 ENCOUNTER — Other Ambulatory Visit: Payer: Self-pay

## 2020-03-25 ENCOUNTER — Ambulatory Visit (INDEPENDENT_AMBULATORY_CARE_PROVIDER_SITE_OTHER): Payer: Medicare Other

## 2020-03-25 DIAGNOSIS — L570 Actinic keratosis: Secondary | ICD-10-CM

## 2020-03-25 MED ORDER — AMINOLEVULINIC ACID HCL 20 % EX SOLR
1.0000 "application " | Freq: Once | CUTANEOUS | Status: AC
  Administered 2020-03-25: 354 mg via TOPICAL

## 2020-03-25 NOTE — Patient Instructions (Signed)

## 2020-03-25 NOTE — Progress Notes (Signed)

## 2020-05-04 ENCOUNTER — Encounter: Payer: Self-pay | Admitting: Dermatology

## 2020-05-04 ENCOUNTER — Other Ambulatory Visit: Payer: Self-pay

## 2020-05-04 ENCOUNTER — Ambulatory Visit (INDEPENDENT_AMBULATORY_CARE_PROVIDER_SITE_OTHER): Payer: Medicare Other | Admitting: Dermatology

## 2020-05-04 DIAGNOSIS — L820 Inflamed seborrheic keratosis: Secondary | ICD-10-CM | POA: Diagnosis not present

## 2020-05-04 DIAGNOSIS — L720 Epidermal cyst: Secondary | ICD-10-CM | POA: Diagnosis not present

## 2020-05-04 DIAGNOSIS — L570 Actinic keratosis: Secondary | ICD-10-CM

## 2020-05-04 DIAGNOSIS — L578 Other skin changes due to chronic exposure to nonionizing radiation: Secondary | ICD-10-CM

## 2020-05-04 DIAGNOSIS — Z85828 Personal history of other malignant neoplasm of skin: Secondary | ICD-10-CM | POA: Diagnosis not present

## 2020-05-04 NOTE — Progress Notes (Signed)
Follow-Up Visit   Subjective  Caitlin Roy is a 80 y.o. female who presents for the following: Follow-up (Patient here for follow-up Aks. She has had 2 PDT treatments to the face. She got a good reaction. She has a history of BCCs. She has a few irritated spots to check and possibly treat on the chest and chin.).  Topical Rx cream didn't help clear spots on chest.   The following portions of the chart were reviewed this encounter and updated as appropriate:       Review of Systems:  No other skin or systemic complaints except as noted in HPI or Assessment and Plan.  Objective  Well appearing patient in no apparent distress; mood and affect are within normal limits.  A focused examination was performed including face, chest. Relevant physical exam findings are noted in the Assessment and Plan.  Objective  L nasal root, multiple areas (see History): Hypertrophic scar at medial edge, no evidence of recurrence.   Objective  Left perioral: 6.53mm firm sq nodule  Objective  Right Upper Lip x 3 (3): Pink scaly macule.  Objective  R upper sternum x 2 (2): Erythematous keratotic or waxy stuck-on papule     Assessment & Plan   Actinic Damage - chronic, secondary to cumulative UV radiation exposure/sun exposure over time - diffuse scaly erythematous macules with underlying dyspigmentation - Recommend daily broad spectrum sunscreen SPF 30+ to sun-exposed areas, reapply every 2 hours as needed.  - Recommend staying in the shade or wearing long sleeves, sun glasses (UVA+UVB protection) and wide brim hats (4-inch brim around the entire circumference of the hat). - Call for new or changing lesions.   History of basal cell carcinoma (BCC) L nasal root, multiple areas (see History)  Clear. With hypertrophic scar at edge. Observe for recurrence. Call clinic for new or changing lesions.  Recommend regular skin exams, daily broad-spectrum spf 30+ sunscreen use, and photoprotection.     Serica scar gel Apply BID - samples given.   Patient has follow-up with [facility] surgeon in July 2022.  He may consider IL steroid injections if not improved  Epidermal inclusion cyst Left perioral  Benign-appearing. Exam most consistent with an epidermal inclusion cyst. Discussed that a cyst is a benign growth that can grow over time and sometimes get irritated or inflamed. Recommend observation if it is not bothersome. Discussed option of surgical excision to remove it if it is growing, symptomatic, or other changes noted. Please call for new or changing lesions so they can be evaluated.  Cyst with symptoms and/or recent change.  Discussed surgical excision to remove, including resulting scar and possible recurrence.  Patient will schedule for surgery. Pre-op information given.     AK (actinic keratosis) (3) Right Upper Lip x 3  Prior to procedure, discussed risks of blister formation, small wound, skin dyspigmentation, or rare scar following cryotherapy.    Destruction of lesion - Right Upper Lip x 3  Destruction method: cryotherapy   Informed consent: discussed and consent obtained   Lesion destroyed using liquid nitrogen: Yes   Region frozen until ice ball extended beyond lesion: Yes   Outcome: patient tolerated procedure well with no complications   Post-procedure details: wound care instructions given    Inflamed seborrheic keratosis (2) R upper sternum x 2  Prior to procedure, discussed risks of blister formation, small wound, skin dyspigmentation, or rare scar following cryotherapy.    Destruction of lesion - R upper sternum x 2  Destruction method: cryotherapy   Informed consent: discussed and consent obtained   Lesion destroyed using liquid nitrogen: Yes   Region frozen until ice ball extended beyond lesion: Yes   Outcome: patient tolerated procedure well with no complications   Post-procedure details: wound care instructions given    Other Related  Medications mometasone (ELOCON) 0.1 % cream  Return 05/24/20 for surgery, cyst of the left perioral. Also TBSE Dec 2022.Lindi Adie, CMA, am acting as scribe for Brendolyn Patty, MD .  Documentation: I have reviewed the above documentation for accuracy and completeness, and I agree with the above.  Brendolyn Patty MD

## 2020-05-04 NOTE — Patient Instructions (Addendum)
If you have any questions or concerns for your doctor, please call our main line at [phone] and press option 4 to reach your doctor's medical assistant. If no one answers, please leave a voicemail as directed and we will return your call as soon as possible. Messages left after 4 pm will be answered the following business day.   You may also send [place] a message via [place]. We typically respond to MyChart messages within 1-2 business days.  For prescription refills, please ask your pharmacy to contact our office. Our fax number is [phone].  If you have an urgent issue when the clinic is closed that cannot wait until the next business day, you can page your doctor at the number below.    Please note that while we do our best to be available for urgent issues outside of office hours, we are not available 24/7.   If you have an urgent issue and are unable to reach [place], you may choose to seek medical care at your doctor's office, retail clinic, urgent care center, or emergency room.  If you have a medical emergency, please immediately call 911 or go to the emergency department.  Pager Numbers  - Dr. Nehemiah Massed: [phone]  - Dr. Laurence Ferrari: [phone]  - Dr. Nicole Kindred: [phone]  In the event of inclement weather, please call our main line at [phone] for an update on the status of any delays or closures.  Dermatology Medication Tips: Please keep the boxes that topical medications come in in order to help keep track of the instructions about where and how to use these. Pharmacies typically print the medication instructions only on the boxes and not directly on the medication tubes.   If your medication is too expensive, please contact our office at [phone] option 4 or send [place] a message through [place].   We are unable to tell what your co-pay for medications will be in advance as this is different depending on your insurance coverage. However, we may be able to find a substitute  medication at lower cost or fill out paperwork to get insurance to cover a needed medication.   If a prior authorization is required to get your medication covered by your insurance company, please allow [place] 1-2 business days to complete this process.  Drug prices often vary depending on where the prescription is filled and some pharmacies may offer cheaper prices.  The website www.goodrx.com contains coupons for medications through different pharmacies. The prices here do not account for what the cost may be with help from insurance (it may be cheaper with your insurance), but the website can give you the price if you did not use any insurance.  - You can print the associated coupon and take it with your prescription to the pharmacy.  - You may also stop by our office during regular business hours and pick up a GoodRx coupon card.  - If you need your prescription sent electronically to a different pharmacy, notify our office through [facility] or by phone at [phone] option 4.     Pre-Operative Instructions  You are scheduled for a surgical procedure at [facility]. We recommend you read the following instructions. If you have any questions or concerns, please call the office at [phone].  1. Shower and wash the entire body with soap and water the day of your surgery paying special attention to cleansing at and around the planned surgery site.  2. Avoid aspirin or aspirin containing products at  least fourteen (14) days prior to your surgical procedure and for at least one week (7 Days) after your surgical procedure. If you take aspirin on a regular basis for heart disease or history of stroke or for any other reason, we may recommend you continue taking aspirin but please notify us if you take this on a regular basis. Aspirin can cause more bleeding to occur during surgery as well as prolonged bleeding and bruising after surgery.   3. Avoid other nonsteroidal pain  medications at least one week prior to surgery and at least one week prior to your surgery. These include medications such as Ibuprofen (Motrin, Advil and Nuprin), Naprosyn, Voltaren, Relafen, etc. If medications are used for therapeutic reasons, please inform us as they can cause increased bleeding or prolonged bleeding during and bruising after surgical procedures.   4. Please advise [place] if you are taking any "blood thinner" medications such as Coumadin or Dipyridamole or Plavix or similar medications. These cause increased bleeding and prolonged bleeding during procedures and bruising after surgical procedures. We may have to consider discontinuing these medications briefly prior to and shortly after your surgery if safe to do so.   5. Please inform us of all medications you are currently taking. All medications that are taken regularly should be taken the day of surgery as you always do. Nevertheless, we need to be informed of what medications you are taking prior to surgery to know whether they will affect the procedure or cause any complications.   6. Please inform us of any medication allergies. Also inform us of whether you have allergies to Latex or rubber products or whether you have had any adverse reaction to Lidocaine or Epinephrine.  7. Please inform us of any prosthetic or artificial body parts such as artificial heart valve, joint replacements, etc., or similar condition that might require preoperative antibiotics.   8. We recommend avoidance of alcohol at least two weeks prior to surgery and continued avoidance for at least two weeks after surgery.   9. We recommend discontinuation of tobacco smoking at least two weeks prior to surgery and continued abstinence for at least two weeks after surgery.  10. Do not plan strenuous exercise, strenuous work or strenuous lifting for approximately four weeks after your surgery.   11. We request if you are unable to make your scheduled surgical  appointment, please call us at least a week in advance or as soon as you are aware of a problem so that we can cancel or reschedule the appointment.   12. You MAY TAKE TYLENOL (acetaminophen) for pain as it is not a blood thinner.   13. PLEASE PLAN TO BE IN TOWN FOR TWO WEEKS FOLLOWING SURGERY, THIS IS IMPORTANT SO YOU CAN BE CHECKED FOR DRESSING CHANGES, SUTURE REMOVAL AND TO MONITOR FOR POSSIBLE COMPLICATIONS.   Cryotherapy Aftercare  . Wash gently with soap and water everyday.   Marland Kitchen Apply Vaseline and Band-Aid daily until healed.

## 2020-05-24 ENCOUNTER — Other Ambulatory Visit: Payer: Self-pay

## 2020-05-24 ENCOUNTER — Ambulatory Visit (INDEPENDENT_AMBULATORY_CARE_PROVIDER_SITE_OTHER): Payer: Medicare Other | Admitting: Dermatology

## 2020-05-24 ENCOUNTER — Telehealth: Payer: Self-pay

## 2020-05-24 DIAGNOSIS — L720 Epidermal cyst: Secondary | ICD-10-CM

## 2020-05-24 NOTE — Patient Instructions (Signed)

## 2020-05-24 NOTE — Progress Notes (Signed)
   Follow-Up Visit   Subjective  Caitlin Roy is a 80 y.o. female who presents for the following: Cyst (L perioral, pt presents for excision). Symptomatic, has grown.   The following portions of the chart were reviewed this encounter and updated as appropriate:       Review of Systems:  No other skin or systemic complaints except as noted in HPI or Assessment and Plan.  Objective  Well appearing patient in no apparent distress; mood and affect are within normal limits.  A focused examination was performed including face. Relevant physical exam findings are noted in the Assessment and Plan.  Objective  L perioral: Firm white papule 0.7cm   Assessment & Plan  Epidermal cyst L perioral  Cyst vs other  Skin excision - L perioral  Lesion length (cm):  0.7 Lesion width (cm):  0.6 Margin per side (cm):  0.1 Total excision diameter (cm):  0.9 Informed consent: discussed and consent obtained   Timeout: patient name, date of birth, surgical site, and procedure verified   Procedure prep:  Patient was prepped and draped in usual sterile fashion Prep type:  Povidone-iodine Anesthesia: the lesion was anesthetized in a standard fashion   Anesthetic:  1% lidocaine w/ epinephrine 1-100,000 buffered w/ 8.4% NaHCO3 (3.0cc) Instrument used comment:  #15c blade Hemostasis achieved with: pressure and electrodesiccation   Outcome: patient tolerated procedure well with no complications    Skin repair - L perioral Complexity:  Intermediate Final length (cm):  0.8 Informed consent: discussed and consent obtained   Timeout: patient name, date of birth, surgical site, and procedure verified   Undermining: edges undermined   Subcutaneous layers (deep stitches):  Suture size:  5-0 Suture type: Vicryl (polyglactin 910)   Stitches:  Buried vertical mattress Fine/surface layer approximation (top stitches):  Suture size:  5-0 Suture type: nylon   Stitches: simple interrupted   Suture  removal (days):  7 Hemostasis achieved with: suture Outcome: patient tolerated procedure well with no complications   Post-procedure details: sterile dressing applied and wound care instructions given   Dressing type: pressure dressing (Mupirocin)    Specimen 1 - Surgical pathology Differential Diagnosis: D48.5 Cyst vs other  Check Margins: No Cystic pap 0.7cm  Return in about 1 week (around 05/31/2020) for suture removal, nurses schedule.   I, Othelia Pulling, RMA, am acting as scribe for Brendolyn Patty, MD .  Documentation: I have reviewed the above documentation for accuracy and completeness, and I agree with the above.  Brendolyn Patty MD

## 2020-05-24 NOTE — Telephone Encounter (Signed)
Left patient a message to call if any problems after today's surgery.Caitlin Roy

## 2020-05-31 ENCOUNTER — Ambulatory Visit: Payer: Medicare Other

## 2020-05-31 ENCOUNTER — Other Ambulatory Visit: Payer: Self-pay

## 2020-05-31 DIAGNOSIS — L720 Epidermal cyst: Secondary | ICD-10-CM

## 2020-05-31 NOTE — Progress Notes (Signed)
   Follow-Up Visit   Subjective  Caitlin Roy is a 80 y.o. female who presents for the following: Suture / Staple Removal (7 day suture removal post excision of biopsy proven epidermal inclusion cyst).  Objective  Head - Anterior (Face): 7 day post excision suture removal   Incision site is clean, dry and intact   Assessment & Plan  Epidermal inclusion cyst Head - Anterior (Face)  Encounter for Removal of Sutures - Incision site at the left perioral is clean, dry and intact - Wound cleansed, sutures removed, wound cleansed and steri strips applied.  - Discussed pathology results showing epidermal inclusion cyst.  - Scars remodel for a full year. - Patient advised to call with any concerns or if they notice any new or changing lesions.   No follow-ups on file.   I, Harriett Sine, CMA, am acting as scribe for [employer], CMA.

## 2020-09-01 ENCOUNTER — Other Ambulatory Visit: Payer: Self-pay | Admitting: Internal Medicine

## 2020-09-01 DIAGNOSIS — Z1231 Encounter for screening mammogram for malignant neoplasm of breast: Secondary | ICD-10-CM

## 2020-11-05 ENCOUNTER — Ambulatory Visit
Admission: RE | Admit: 2020-11-05 | Discharge: 2020-11-05 | Disposition: A | Discharge status: Home / Self Care | Payer: Medicare Other | Source: Ambulatory Visit | Attending: Internal Medicine | Admitting: Internal Medicine

## 2020-11-05 ENCOUNTER — Other Ambulatory Visit: Payer: Self-pay

## 2020-11-05 DIAGNOSIS — Z1231 Encounter for screening mammogram for malignant neoplasm of breast: Secondary | ICD-10-CM

## 2021-01-18 ENCOUNTER — Ambulatory Visit (INDEPENDENT_AMBULATORY_CARE_PROVIDER_SITE_OTHER): Payer: Medicare Other | Admitting: Dermatology

## 2021-01-18 ENCOUNTER — Other Ambulatory Visit: Payer: Self-pay

## 2021-01-18 DIAGNOSIS — L821 Other seborrheic keratosis: Secondary | ICD-10-CM | POA: Diagnosis not present

## 2021-01-18 DIAGNOSIS — C44319 Basal cell carcinoma of skin of other parts of face: Secondary | ICD-10-CM | POA: Diagnosis not present

## 2021-01-18 DIAGNOSIS — L814 Other melanin hyperpigmentation: Secondary | ICD-10-CM

## 2021-01-18 DIAGNOSIS — L570 Actinic keratosis: Secondary | ICD-10-CM | POA: Diagnosis not present

## 2021-01-18 DIAGNOSIS — L720 Epidermal cyst: Secondary | ICD-10-CM

## 2021-01-18 DIAGNOSIS — Z1283 Encounter for screening for malignant neoplasm of skin: Secondary | ICD-10-CM | POA: Diagnosis not present

## 2021-01-18 DIAGNOSIS — L820 Inflamed seborrheic keratosis: Secondary | ICD-10-CM

## 2021-01-18 DIAGNOSIS — L578 Other skin changes due to chronic exposure to nonionizing radiation: Secondary | ICD-10-CM

## 2021-01-18 DIAGNOSIS — D485 Neoplasm of uncertain behavior of skin: Secondary | ICD-10-CM

## 2021-01-18 DIAGNOSIS — C4491 Basal cell carcinoma of skin, unspecified: Secondary | ICD-10-CM

## 2021-01-18 HISTORY — DX: Basal cell carcinoma of skin, unspecified: C44.91

## 2021-01-18 NOTE — Patient Instructions (Addendum)
Cryotherapy Aftercare  Wash gently with soap and water everyday.   Apply Vaseline and Band-Aid daily until healed.    Wound Care Instructions  Cleanse wound gently with soap and water once a day then pat dry with clean gauze. Apply a thing coat of Petrolatum (petroleum jelly, "Vaseline") over the wound (unless you have an allergy to this). We recommend that you use a new, sterile tube of Vaseline. Do not pick or remove scabs. Do not remove the yellow or white "healing tissue" from the base of the wound.  Cover the wound with fresh, clean, nonstick gauze and secure with paper tape. You may use Band-Aids in place of gauze and tape if the would is small enough, but would recommend trimming much of the tape off as there is often too much. Sometimes Band-Aids can irritate the skin.  You should call the office for your biopsy report after 1 week if you have not already been contacted.  If you experience any problems, such as abnormal amounts of bleeding, swelling, significant bruising, significant pain, or evidence of infection, please call the office immediately.  FOR ADULT SURGERY PATIENTS: If you need something for pain relief you may take 1 extra strength Tylenol (acetaminophen) AND 2 Ibuprofen (200mg each) together every 4 hours as needed for pain. (do not take these if you are allergic to them or if you have a reason you should not take them.) Typically, you may only need pain medication for 1 to 3 days.        If You Need Anything After Your Visit  If you have any questions or concerns for your doctor, please call our main line at [phone] and press option 4 to reach your doctor's medical assistant. If no one answers, please leave a voicemail as directed and we will return your call as soon as possible. Messages left after 4 pm will be answered the following business day.   You may also send us a message via MyChart. We typically respond to MyChart messages within 1-2 business  days.  For prescription refills, please ask your pharmacy to contact our office. Our fax number is [phone].  If you have an urgent issue when the clinic is closed that cannot wait until the next business day, you can page your doctor at the number below.    Please note that while we do our best to be available for urgent issues outside of office hours, we are not available 24/7.   If you have an urgent issue and are unable to reach us, you may choose to seek medical care at your doctor's office, retail clinic, urgent care center, or emergency room.  If you have a medical emergency, please immediately call 911 or go to the emergency department.  Pager Numbers  - Dr. Kowalski: [phone]  - Dr. Moye: [phone]  - Dr. Stewart: [phone]  In the event of inclement weather, please call our main line at [phone] for an update on the status of any delays or closures.  Dermatology Medication Tips: Please keep the boxes that topical medications come in in order to help keep track of the instructions about where and how to use these. Pharmacies typically print the medication instructions only on the boxes and not directly on the medication tubes.   If your medication is too expensive, please contact our office at [phone] option 4 or send us a message through MyChart.   We are unable to tell what your co-pay for medications will be   in advance as this is different depending on your insurance coverage. However, we may be able to find a substitute medication at lower cost or fill out paperwork to get insurance to cover a needed medication.   If a prior authorization is required to get your medication covered by your insurance company, please allow us 1-2 business days to complete this process.  Drug prices often vary depending on where the prescription is filled and some pharmacies may offer cheaper prices.  The website www.goodrx.com contains coupons for medications through  different pharmacies. The prices here do not account for what the cost may be with help from insurance (it may be cheaper with your insurance), but the website can give you the price if you did not use any insurance.  - You can print the associated coupon and take it with your prescription to the pharmacy.  - You may also stop by our office during regular business hours and pick up a GoodRx coupon card.  - If you need your prescription sent electronically to a different pharmacy, notify our office through [place] MyChart or by phone at [phone] option 4.     Si Usted Necesita Algo Despus de Su Visita  Tambin puede enviarnos un mensaje a travs de MyChart. Por lo general respondemos a los mensajes de MyChart en el transcurso de 1 a 2 das hbiles.  Para renovar recetas, por favor pida a su farmacia que se ponga en contacto con nuestra oficina. Nuestro nmero de fax es el [phone].  Si tiene un asunto urgente cuando la clnica est cerrada y que no puede esperar hasta el siguiente da hbil, puede llamar/localizar a su doctor(a) al nmero que aparece a continuacin.   Por favor, tenga en cuenta que aunque hacemos todo lo posible para estar disponibles para asuntos urgentes fuera del horario de oficina, no estamos disponibles las 24 horas del da, los 7 das de la semana.   Si tiene un problema urgente y no puede comunicarse con nosotros, puede optar por buscar atencin mdica  en el consultorio de su doctor(a), en una clnica privada, en un centro de atencin urgente o en una sala de emergencias.  Si tiene una emergencia mdica, por favor llame inmediatamente al 911 o vaya a la sala de emergencias.  Nmeros de bper  - Dr. Kowalski: [phone]  - Dra. Moye: [phone]  - Dra. Stewart: [phone]  En caso de inclemencias del tiempo, por favor llame a nuestra lnea principal al [phone] para una actualizacin sobre el estado de cualquier retraso o cierre.  Consejos  para la medicacin en dermatologa: Por favor, guarde las cajas en las que vienen los medicamentos de uso tpico para ayudarle a seguir las instrucciones sobre dnde y cmo usarlos. Las farmacias generalmente imprimen las instrucciones del medicamento slo en las cajas y no directamente en los tubos del medicamento.   Si su medicamento es muy caro, por favor, pngase en contacto con nuestra oficina llamando al [phone] y presione la opcin 4 o envenos un mensaje a travs de MyChart.   No podemos decirle cul ser su copago por los medicamentos por adelantado ya que esto es diferente dependiendo de la cobertura de su seguro. Sin embargo, es posible que podamos encontrar un medicamento sustituto a menor costo o llenar un formulario para que el seguro cubra el medicamento que se considera necesario.   Si se requiere una autorizacin previa para que su compaa de seguros cubra su medicamento, por favor permtanos de 1 a   2 das hbiles para completar este proceso.  Los precios de los medicamentos varan con frecuencia dependiendo del lugar de dnde se surte la receta y alguna farmacias pueden ofrecer precios ms baratos.  El sitio web www.goodrx.com tiene cupones para medicamentos de diferentes farmacias. Los precios aqu no tienen en cuenta lo que podra costar con la ayuda del seguro (puede ser ms barato con su seguro), pero el sitio web puede darle el precio si no utiliz ningn seguro.  - Puede imprimir el cupn correspondiente y llevarlo con su receta a la farmacia.  - Tambin puede pasar por nuestra oficina durante el horario de atencin regular y recoger una tarjeta de cupones de GoodRx.  - Si necesita que su receta se enve electrnicamente a una farmacia diferente, informe a nuestra oficina a travs de MyChart de [place] o por telfono llamando al [phone] y presione la opcin 4.  

## 2021-01-18 NOTE — Progress Notes (Addendum)
Follow-Up Visit   Subjective  Caitlin Roy is a 80 y.o. female who presents for the following: Follow-up.  Patient presents for TBSE. The patient has spots, moles and lesions to be evaluated, some may be new or changing. She has a new spot on the right medial brow with a history of bleeding, itchy spots on the back, spot on the left leg that is sensitive, and itchy bumps on her lower lip she would like removed.   The following portions of the chart were reviewed this encounter and updated as appropriate:       Review of Systems:  No other skin or systemic complaints except as noted in HPI or Assessment and Plan.  Objective  Well appearing patient in no apparent distress; mood and affect are within normal limits.  A full examination was performed including scalp, head, eyes, ears, nose, lips, neck, chest, axillae, abdomen, back, buttocks, bilateral upper extremities, bilateral lower extremities, hands, feet, fingers, toes, fingernails, and toenails. All findings within normal limits unless otherwise noted below.  L lower pretibia x 1, L upper back x 6, L upper arm x 1, L lower neck x 1 (9) Erythematous keratotic or waxy stuck-on papule   Right Medial Eyebrow 0.6cm pink crusted papule     Lower Lip at [facility] Smooth, firm, white papules.   L hand dosrum x 3, R hand dorsum x 3, nasal dorsum x 1 (7) Pink scaly macules.    Assessment & Plan  Skin cancer screening performed today.  Actinic Damage - chronic, secondary to cumulative UV radiation exposure/sun exposure over time - diffuse scaly erythematous macules with underlying dyspigmentation - Recommend daily broad spectrum sunscreen SPF 30+ to sun-exposed areas, reapply every 2 hours as needed.  - Recommend staying in the shade or wearing long sleeves, sun glasses (UVA+UVB protection) and wide brim hats (4-inch brim around the entire circumference of the hat). - Call for new or changing lesions.  Seborrheic  Keratoses - Stuck-on, waxy, tan-brown papules and/or plaques  - Benign-appearing - Discussed benign etiology and prognosis. - Observe - Call for any changes  Lentigines - Scattered tan macules - Due to sun exposure - Benign-appering, observe - Recommend daily broad spectrum sunscreen SPF 30+ to sun-exposed areas, reapply every 2 hours as needed. - Call for any changes  Inflamed seborrheic keratosis L lower pretibia x 1, L upper back x 6, L upper arm x 1, L lower neck x 1  Destruction of lesion - L lower pretibia x 1, L upper back x 6, L upper arm x 1, L lower neck x 1  Destruction method: cryotherapy   Informed consent: discussed and consent obtained   Lesion destroyed using liquid nitrogen: Yes   Region frozen until ice ball extended beyond lesion: Yes   Outcome: patient tolerated procedure well with no complications   Post-procedure details: wound care instructions given   Additional details:  Prior to procedure, discussed risks of blister formation, small wound, skin dyspigmentation, or rare scar following cryotherapy. Recommend Vaseline ointment to treated areas while healing.   Related Medications mometasone (ELOCON) 0.1 % cream Apply 1 application topically daily as needed (Rash).  Neoplasm of uncertain behavior of skin Right Medial Eyebrow  Skin / nail biopsy Type of biopsy: tangential   Informed consent: discussed and consent obtained   Patient was prepped and draped in usual sterile fashion: Area prepped with alcohol. Anesthesia: the lesion was anesthetized in a standard fashion   Anesthetic:  1% lidocaine  w/ epinephrine 1-100,000 buffered w/ 8.4% NaHCO3 Instrument used: flexible razor blade   Hemostasis achieved with: pressure, aluminum chloride and electrodesiccation   Outcome: patient tolerated procedure well   Post-procedure details: wound care instructions given   Post-procedure details comment:  Ointment and small bandage applied  Specimen 1 - Surgical  pathology Differential Diagnosis: Inflamed SK r/o BCC Check Margins: No 0.6cm pink crusted papule  Discussed EDC vs excision if positive.   Milia Lower Lip at [facility]  Irritated  Acne/Milia surgery - Lower Lip at [facility] Procedure risks and benefits were discussed with the patient and verbal consent was obtained. Following prep of the skin on the lower lip at vermilion edge with an alcohol swab and local anesthesia injection, extraction of milia x 3 was performed with a comedone extractor following superficial incision made over their surfaces with a #11 blade. Capillary hemostasis was achieved with 20% aluminum chloride solution. Vaseline ointment was applied to each site. The patient tolerated the procedure well.  AK (actinic keratosis) (7) L hand dosrum x 3, R hand dorsum x 3, nasal dorsum x 1  Actinic keratoses are precancerous spots that appear secondary to cumulative UV radiation exposure/sun exposure over time. They are chronic with expected duration over 1 year. A portion of actinic keratoses will progress to squamous cell carcinoma of the skin. It is not possible to reliably predict which spots will progress to skin cancer and so treatment is recommended to prevent development of skin cancer.  Recommend daily broad spectrum sunscreen SPF 30+ to sun-exposed areas, reapply every 2 hours as needed.  Recommend staying in the shade or wearing long sleeves, sun glasses (UVA+UVB protection) and wide brim hats (4-inch brim around the entire circumference of the hat). Call for new or changing lesions.  Destruction of lesion - L hand dosrum x 3, R hand dorsum x 3, nasal dorsum x 1  Destruction method: cryotherapy   Informed consent: discussed and consent obtained   Lesion destroyed using liquid nitrogen: Yes   Region frozen until ice ball extended beyond lesion: Yes   Outcome: patient tolerated procedure well with no complications   Post-procedure details: wound care instructions  given   Additional details:  Prior to procedure, discussed risks of blister formation, small wound, skin dyspigmentation, or rare scar following cryotherapy. Recommend Vaseline ointment to treated areas while healing.   Return in about 6 months (around 07/19/2021) for AKs, h/o BCC.  IJamesetta Orleans, CMA, am acting as scribe for Brendolyn Patty, MD . Documentation: I have reviewed the above documentation for accuracy and completeness, and I agree with the above.  Brendolyn Patty MD

## 2021-01-20 ENCOUNTER — Telehealth: Payer: Self-pay

## 2021-01-20 NOTE — Telephone Encounter (Signed)
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

## 2021-01-20 NOTE — Telephone Encounter (Signed)
Advised pt of bx results and scheduled pt for EDC./sh 

## 2021-02-16 ENCOUNTER — Other Ambulatory Visit: Payer: Self-pay

## 2021-02-16 ENCOUNTER — Encounter: Payer: Self-pay | Admitting: Dermatology

## 2021-02-16 ENCOUNTER — Ambulatory Visit (INDEPENDENT_AMBULATORY_CARE_PROVIDER_SITE_OTHER): Payer: Medicare Other | Admitting: Dermatology

## 2021-02-16 DIAGNOSIS — C44319 Basal cell carcinoma of skin of other parts of face: Secondary | ICD-10-CM | POA: Diagnosis not present

## 2021-02-16 DIAGNOSIS — L578 Other skin changes due to chronic exposure to nonionizing radiation: Secondary | ICD-10-CM | POA: Diagnosis not present

## 2021-02-16 NOTE — Patient Instructions (Signed)
Wound Care Instructions  Cleanse wound gently with soap and water once a day then pat dry with clean gauze. Apply a thing coat of Petrolatum (petroleum jelly, "Vaseline") over the wound (unless you have an allergy to this). We recommend that you use a new, sterile tube of Vaseline. Do not pick or remove scabs. Do not remove the yellow or white "healing tissue" from the base of the wound.  Cover the wound with fresh, clean, nonstick gauze and secure with paper tape. You may use Band-Aids in place of gauze and tape if the would is small enough, but would recommend trimming much of the tape off as there is often too much. Sometimes Band-Aids can irritate the skin.   If you experience any problems, such as abnormal amounts of bleeding, swelling, significant bruising, significant pain, or evidence of infection, please call the office immediately.  FOR ADULT SURGERY PATIENTS: If you need something for pain relief you may take 1 extra strength Tylenol (acetaminophen) AND 2 Ibuprofen (200mg  each) together every 4 hours as needed for pain. (do not take these if you are allergic to them or if you have a reason you should not take them.) Typically, you may only need pain medication for 1 to 3 days.    If You Need Anything After Your Visit  If you have any questions or concerns for your doctor, please call our main line at [phone] and press option 4 to reach your doctor's medical assistant. If no one answers, please leave a voicemail as directed and we will return your call as soon as possible. Messages left after 4 pm will be answered the following business day.   You may also send [place] a message via [place]. We typically respond to MyChart messages within 1-2 business days.  For prescription refills, please ask your pharmacy to contact our office. Our fax number is [phone].  If you have an urgent issue when the clinic is closed that cannot wait until the next business day, you can page your doctor  at the number below.    Please note that while we do our best to be available for urgent issues outside of office hours, we are not available 24/7.   If you have an urgent issue and are unable to reach [place], you may choose to seek medical care at your doctor's office, retail clinic, urgent care center, or emergency room.  If you have a medical emergency, please immediately call 911 or go to the emergency department.  Pager Numbers  - Dr. Nehemiah Massed: [phone]  - Dr. Laurence Ferrari: [phone]  - Dr. Nicole Kindred: [phone]  In the event of inclement weather, please call our main line at [phone] for an update on the status of any delays or closures.  Dermatology Medication Tips: Please keep the boxes that topical medications come in in order to help keep track of the instructions about where and how to use these. Pharmacies typically print the medication instructions only on the boxes and not directly on the medication tubes.   If your medication is too expensive, please contact our office at [phone] option 4 or send [place] a message through [place].   We are unable to tell what your co-pay for medications will be in advance as this is different depending on your insurance coverage. However, we may be able to find a substitute medication at lower cost or fill out paperwork to get insurance to cover a needed medication.   If a prior authorization is required to  get your medication covered by your insurance company, please allow [place] 1-2 business days to complete this process.  Drug prices often vary depending on where the prescription is filled and some pharmacies may offer cheaper prices.  The website www.goodrx.com contains coupons for medications through different pharmacies. The prices here do not account for what the cost may be with help from insurance (it may be cheaper with your insurance), but the website can give you the price if you did not use any insurance.  - You can print the  associated coupon and take it with your prescription to the pharmacy.  - You may also stop by our office during regular business hours and pick up a GoodRx coupon card.  - If you need your prescription sent electronically to a different pharmacy, notify our office through [facility] or by phone at [phone] option 4.     Si Usted Necesita Algo Despus de Su Visita  Tambin puede enviarnos un mensaje a travs de Pharmacist, community. Por lo general respondemos a los mensajes de MyChart en el transcurso de 1 a 2 das hbiles.  Para renovar recetas, por favor pida a su farmacia que se ponga en contacto con nuestra oficina. Harland Dingwall de fax es [place] [phone].  Si tiene un asunto urgente cuando la clnica est cerrada y que no puede esperar hasta el siguiente da hbil, puede llamar/localizar a su doctor(a) al nmero que aparece a continuacin.   Por favor, tenga en cuenta que aunque hacemos todo lo posible para estar disponibles para asuntos urgentes fuera del horario de [place], no estamos disponibles las 24 horas del da, los 7 das de la [place].   Si tiene un problema urgente y no puede comunicarse con nosotros, puede optar por buscar atencin mdica  en el consultorio de su doctor(a), en una clnica privada, en un centro de atencin urgente o en una sala de emergencias.  Si tiene Engineering geologist, por favor llame inmediatamente al 911 o vaya a la sala de emergencias.  Nmeros de bper  - Dr. Nehemiah Massed: [phone]  - Dra. Moye: [phone]  - Dra. Nicole Kindred: [phone]  En caso de inclemencias del [place], por favor llame a Johnsie Kindred principal al [phone] para una actualizacin sobre el [place] de cualquier retraso o cierre.  Consejos para la medicacin en dermatologa: Por favor, guarde las cajas en las que vienen los medicamentos de uso tpico para ayudarle a seguir las instrucciones sobre dnde y cmo usarlos. Las farmacias generalmente imprimen las instrucciones  del medicamento slo en las cajas y no directamente en los tubos del [place].   Si su medicamento es muy caro, por favor, pngase en contacto con Zigmund Daniel llamando al [phone] y presione la opcin 4 o envenos un mensaje a travs de Pharmacist, community.   No podemos decirle cul ser su copago por los medicamentos por adelantado ya que esto es diferente dependiendo de la cobertura de su seguro. Sin embargo, es posible que podamos encontrar un medicamento sustituto a Electrical engineer un formulario para que el seguro cubra el medicamento que se considera necesario.   Si se requiere una autorizacin previa para que su compaa de seguros [place] su medicamento, por favor permtanos de 1 a 2 das hbiles para completar este proceso.  Los precios de los medicamentos varan con frecuencia dependiendo del Environmental consultant de dnde se surte la receta y alguna farmacias pueden ofrecer precios ms baratos.  El sitio web www.goodrx.com tiene cupones para medicamentos de Airline pilot.  Los precios aqu no tienen en cuenta lo que podra costar con la ayuda del seguro (puede ser ms barato con su seguro), pero el sitio web puede darle el precio si no utiliz Research scientist (physical sciences).  - Puede imprimir el cupn correspondiente y llevarlo con su receta a la farmacia.  - Tambin puede pasar por nuestra oficina durante el horario de atencin regular y Charity fundraiser una tarjeta de cupones de GoodRx.  - Si necesita que su receta se enve electrnicamente a una farmacia diferente, informe a nuestra oficina a travs de MyChart de [place] o por telfono llamando al [phone] y presione la opcin 4.

## 2021-02-16 NOTE — Progress Notes (Signed)
° °  Follow-Up Visit   Subjective  Caitlin Roy is a 81 y.o. female who presents for the following: Basal Cell Carcinoma (Right medial eyebrow, SUPERFICIAL AND NODULAR BASAL CELL CARCINOMA, ULCERATED biopsy proven.).    The following portions of the chart were reviewed this encounter and updated as appropriate:       Review of Systems:  No other skin or systemic complaints except as noted in HPI or Assessment and Plan.  Objective  Well appearing patient in no apparent distress; mood and affect are within normal limits.  A focused examination was performed including face. Relevant physical exam findings are noted in the Assessment and Plan.  Right Medial Eyebrow Pink biopsy site.    Assessment & Plan  Actinic Damage - chronic, secondary to cumulative UV radiation exposure/sun exposure over time - diffuse scaly erythematous macules with underlying dyspigmentation - Recommend daily broad spectrum sunscreen SPF 30+ to sun-exposed areas, reapply every 2 hours as needed.  - Recommend staying in the shade or wearing long sleeves, sun glasses (UVA+UVB protection) and wide brim hats (4-inch brim around the entire circumference of the hat). - Call for new or changing lesions.  Basal cell carcinoma of skin of other parts of face Right Medial Eyebrow  Destruction of lesion  Destruction method: electrodesiccation and curettage   Informed consent: discussed and consent obtained   Timeout:  patient name, date of birth, surgical site, and procedure verified Anesthesia: the lesion was anesthetized in a standard fashion   Anesthetic:  1% lidocaine w/ epinephrine 1-100,000 local infiltration Curettage performed in three different directions: Yes   Electrodesiccation performed over the curetted area: Yes   Final wound size (cm):  0.6 Hemostasis achieved with:  pressure, aluminum chloride and electrodesiccation Outcome: patient tolerated procedure well with no complications    Post-procedure details: wound care instructions given   Post-procedure details comment:  Ointment and bandage applied.  SUPERFICIAL AND NODULAR BASAL CELL CARCINOMA, ULCERATED, biopsy proven   Return as scheduled with Dr Nicole Kindred.  IJamesetta Orleans, CMA, am acting as scribe for Brendolyn Patty, MD . Documentation: I have reviewed the above documentation for accuracy and completeness, and I agree with the above.  Brendolyn Patty MD

## 2021-07-19 ENCOUNTER — Ambulatory Visit (INDEPENDENT_AMBULATORY_CARE_PROVIDER_SITE_OTHER): Payer: Medicare Other | Admitting: Dermatology

## 2021-07-19 DIAGNOSIS — L578 Other skin changes due to chronic exposure to nonionizing radiation: Secondary | ICD-10-CM

## 2021-07-19 DIAGNOSIS — L570 Actinic keratosis: Secondary | ICD-10-CM | POA: Diagnosis not present

## 2021-07-19 DIAGNOSIS — L820 Inflamed seborrheic keratosis: Secondary | ICD-10-CM | POA: Diagnosis not present

## 2021-07-19 DIAGNOSIS — Z85828 Personal history of other malignant neoplasm of skin: Secondary | ICD-10-CM

## 2021-07-19 DIAGNOSIS — L720 Epidermal cyst: Secondary | ICD-10-CM

## 2021-07-19 NOTE — Patient Instructions (Addendum)
Actinic keratoses are precancerous spots that appear secondary to cumulative UV radiation exposure/sun exposure over time. They are chronic with expected duration over 1 year. A portion of actinic keratoses will progress to squamous cell carcinoma of the skin. It is not possible to reliably predict which spots will progress to skin cancer and so treatment is recommended to prevent development of skin cancer.  Recommend daily broad spectrum sunscreen SPF 30+ to sun-exposed areas, reapply every 2 hours as needed.  Recommend staying in the shade or wearing long sleeves, sun glasses (UVA+UVB protection) and wide brim hats (4-inch brim around the entire circumference of the hat). Call for new or changing lesions.   Cryotherapy Aftercare  Wash gently with soap and water everyday.   Apply Vaseline and Band-Aid daily until healed.   Seborrheic Keratosis  What causes seborrheic keratoses? Seborrheic keratoses are harmless, common skin growths that first appear during adult life.  As time goes by, more growths appear.  Some people may develop a large number of them.  Seborrheic keratoses appear on both covered and uncovered body parts.  They are not caused by sunlight.  The tendency to develop seborrheic keratoses can be inherited.  They vary in color from skin-colored to gray, brown, or even black.  They can be either smooth or have a rough, warty surface.   Seborrheic keratoses are superficial and look as if they were stuck on the skin.  Under the microscope this type of keratosis looks like layers upon layers of skin.  That is why at times the top layer may seem to fall off, but the rest of the growth remains and re-grows.    Treatment Seborrheic keratoses do not need to be treated, but can easily be removed in the office.  Seborrheic keratoses often cause symptoms when they rub on clothing or jewelry.  Lesions can be in the way of shaving.  If they become inflamed, they can cause itching, soreness, or  burning.  Removal of a seborrheic keratosis can be accomplished by freezing, burning, or surgery. If any spot bleeds, scabs, or grows rapidly, please return to have it checked, as these can be an indication of a skin cancer.          Due to recent changes in healthcare laws, you may see results of your pathology and/or laboratory studies on MyChart before the doctors have had a chance to review them. We understand that in some cases there may be results that are confusing or concerning to you. Please understand that not all results are received at the same time and often the doctors may need to interpret multiple results in order to provide you with the best plan of care or course of treatment. Therefore, we ask that you please give us 2 business days to thoroughly review all your results before contacting the office for clarification. Should we see a critical lab result, you will be contacted sooner.   If You Need Anything After Your Visit  If you have any questions or concerns for your doctor, please call our main line at [phone] and press option 4 to reach your doctor's medical assistant. If no one answers, please leave a voicemail as directed and we will return your call as soon as possible. Messages left after 4 pm will be answered the following business day.   You may also send us a message via MyChart. We typically respond to MyChart messages within 1-2 business days.  For prescription refills, please ask your pharmacy   to contact our office. Our fax number is [phone].  If you have an urgent issue when the clinic is closed that cannot wait until the next business day, you can page your doctor at the number below.    Please note that while we do our best to be available for urgent issues outside of office hours, we are not available 24/7.   If you have an urgent issue and are unable to reach us, you may choose to seek medical care at your doctor's office, retail clinic,  urgent care center, or emergency room.  If you have a medical emergency, please immediately call 911 or go to the emergency department.  Pager Numbers  - Dr. Kowalski: [phone]  - Dr. Moye: [phone]  - Dr. Stewart: [phone]  In the event of inclement weather, please call our main line at [phone] for an update on the status of any delays or closures.  Dermatology Medication Tips: Please keep the boxes that topical medications come in in order to help keep track of the instructions about where and how to use these. Pharmacies typically print the medication instructions only on the boxes and not directly on the medication tubes.   If your medication is too expensive, please contact our office at [phone] option 4 or send us a message through MyChart.   We are unable to tell what your co-pay for medications will be in advance as this is different depending on your insurance coverage. However, we may be able to find a substitute medication at lower cost or fill out paperwork to get insurance to cover a needed medication.   If a prior authorization is required to get your medication covered by your insurance company, please allow us 1-2 business days to complete this process.  Drug prices often vary depending on where the prescription is filled and some pharmacies may offer cheaper prices.  The website www.goodrx.com contains coupons for medications through different pharmacies. The prices here do not account for what the cost may be with help from insurance (it may be cheaper with your insurance), but the website can give you the price if you did not use any insurance.  - You can print the associated coupon and take it with your prescription to the pharmacy.  - You may also stop by our office during regular business hours and pick up a GoodRx coupon card.  - If you need your prescription sent electronically to a different pharmacy, notify our office through [place]  MyChart or by phone at [phone] option 4.     Si Usted Necesita Algo Despus de Su Visita  Tambin puede enviarnos un mensaje a travs de MyChart. Por lo general respondemos a los mensajes de MyChart en el transcurso de 1 a 2 das hbiles.  Para renovar recetas, por favor pida a su farmacia que se ponga en contacto con nuestra oficina. Nuestro nmero de fax es el [phone].  Si tiene un asunto urgente cuando la clnica est cerrada y que no puede esperar hasta el siguiente da hbil, puede llamar/localizar a su doctor(a) al nmero que aparece a continuacin.   Por favor, tenga en cuenta que aunque hacemos todo lo posible para estar disponibles para asuntos urgentes fuera del horario de oficina, no estamos disponibles las 24 horas del da, los 7 das de la semana.   Si tiene un problema urgente y no puede comunicarse con nosotros, puede optar por buscar atencin mdica  en el consultorio de su doctor(a), en una   clnica privada, en un centro de atencin urgente o en una sala de emergencias.  Si tiene una emergencia mdica, por favor llame inmediatamente al 911 o vaya a la sala de emergencias.  Nmeros de bper  - Dr. Kowalski: [phone]  - Dra. Moye: [phone]  - Dra. Stewart: [phone]  En caso de inclemencias del tiempo, por favor llame a nuestra lnea principal al [phone] para una actualizacin sobre el estado de cualquier retraso o cierre.  Consejos para la medicacin en dermatologa: Por favor, guarde las cajas en las que vienen los medicamentos de uso tpico para ayudarle a seguir las instrucciones sobre dnde y cmo usarlos. Las farmacias generalmente imprimen las instrucciones del medicamento slo en las cajas y no directamente en los tubos del medicamento.   Si su medicamento es muy caro, por favor, pngase en contacto con nuestra oficina llamando al [phone] y presione la opcin 4 o envenos un mensaje a travs de MyChart.   No podemos decirle cul  ser su copago por los medicamentos por adelantado ya que esto es diferente dependiendo de la cobertura de su seguro. Sin embargo, es posible que podamos encontrar un medicamento sustituto a menor costo o llenar un formulario para que el seguro cubra el medicamento que se considera necesario.   Si se requiere una autorizacin previa para que su compaa de seguros cubra su medicamento, por favor permtanos de 1 a 2 das hbiles para completar este proceso.  Los precios de los medicamentos varan con frecuencia dependiendo del lugar de dnde se surte la receta y alguna farmacias pueden ofrecer precios ms baratos.  El sitio web www.goodrx.com tiene cupones para medicamentos de diferentes farmacias. Los precios aqu no tienen en cuenta lo que podra costar con la ayuda del seguro (puede ser ms barato con su seguro), pero el sitio web puede darle el precio si no utiliz ningn seguro.  - Puede imprimir el cupn correspondiente y llevarlo con su receta a la farmacia.  - Tambin puede pasar por nuestra oficina durante el horario de atencin regular y recoger una tarjeta de cupones de GoodRx.  - Si necesita que su receta se enve electrnicamente a una farmacia diferente, informe a nuestra oficina a travs de MyChart de [place] o por telfono llamando al [phone] y presione la opcin 4.  

## 2021-07-19 NOTE — Progress Notes (Signed)
Follow-Up Visit   Subjective  Caitlin Roy is a 81 y.o. female who presents for the following: Actinic Keratosis (6 month ak follow up and follow up on bcc treated on right medial eyebrow.  Patient reports some rough spot at left hand and right knee. ).   The patient has spots, moles and lesions to be evaluated, some may be new or changing and the patient has concerns that these could be cancer.   The following portions of the chart were reviewed this encounter and updated as appropriate:      Review of Systems: No other skin or systemic complaints except as noted in HPI or Assessment and Plan.   Objective  Well appearing patient in no apparent distress; mood and affect are within normal limits.  A focused examination was performed including right knee, face, b/l hands, b/l arms, neck, right chin. Relevant physical exam findings are noted in the Assessment and Plan.  right hand dorsum x 3, right index finger x 1,  right eyebrow x 3, right medial cheek x 3, right forehead x 3, right medial cheek x 3, left upper lip x 1, left forehead x 4, left temple x 1, left hand dorsum x 2, left posterior neck x 1 (25) Erythematous thin papules/macules with gritty scale.   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 They are chronic with expected duration over 1 year. A portion of actinic keratoses will progress to squamous cell carcinoma of the skin. It is not possible to reliably predict which spots will progress to skin cancer and  so treatment is recommended to prevent development of skin cancer.  Recommend daily broad spectrum sunscreen SPF 30+ to sun-exposed areas, reapply every 2 hours as needed.  Recommend staying in the shade or wearing long sleeves, sun glasses (UVA+UVB protection) and wide brim hats (4-inch brim around the entire circumference of the hat). Call for new or changing lesions.  Destruction of lesion - right hand dorsum x 3, right index finger x 1,  right eyebrow x 3, right medial cheek x 3, right forehead x 3, right medial cheek x 3, left upper lip x 1, left forehead x 4, left temple x 1, left hand dorsum x 2, left posterior neck x 1  Destruction method: cryotherapy   Informed consent: discussed and consent obtained   Lesion destroyed using liquid nitrogen: Yes   Region frozen until ice ball extended beyond lesion: Yes   Outcome: patient tolerated procedure well with no complications   Post-procedure details: wound care instructions given   Additional details:  Prior to procedure, discussed risks of blister formation, small wound, skin dyspigmentation, or rare scar following cryotherapy. Recommend Vaseline ointment to treated areas while healing.   Milia right chin  Irritated and bothers patient   Acne/Milia surgery - right chin Procedure risks and benefits were discussed with the patient and verbal consent was obtained. Following prep of the skin on the right chin  with an alcohol swab and local anesthesia injection, extraction of milia was performed with a comedone extractor following superficial incision made over their surfaces with a #11 surgical blade. Capillary hemostasis was achieved with 20% aluminum chloride solution. Vaseline ointment was applied to each site. The patient tolerated the procedure well.  10 milia extracted   Inflamed seborrheic keratosis right medial knee x 1  Symptomatic, irritating, patient would like treated.  Isk vs prurigo nodule, avoid picking   Will recheck  at follow up  Destruction of lesion - right medial knee x 1  Destruction method: cryotherapy   Informed consent: discussed and consent obtained   Lesion destroyed using liquid nitrogen: Yes   Region frozen until ice ball extended beyond lesion: Yes   Outcome: patient tolerated procedure well with no complications   Post-procedure details: wound care instructions given   Additional details:  Prior to procedure, discussed risks of blister formation, small wound, skin dyspigmentation, or rare scar following cryotherapy. Recommend Vaseline ointment to treated areas while healing.   Related Medications mometasone (ELOCON) 0.1 % cream Apply 1 application topically daily as needed (Rash).   Actinic Damage - chronic, secondary to cumulative UV radiation exposure/sun exposure over time - diffuse scaly erythematous macules with underlying dyspigmentation - Recommend daily broad spectrum sunscreen SPF 30+ to sun-exposed areas, reapply every 2 hours as needed.  - Recommend staying in the shade or wearing long sleeves, sun glasses (UVA+UVB protection) and wide brim hats (4-inch brim around the entire circumference of the hat). - Call for new or changing lesions.  History of Basal Cell Carcinoma of the Skin at right medial eyebrow 01/18/21  ED&C done 02/16/21 - No evidence of recurrence today R med eyebrow, L nasal root (Mohs 2021) also clear - Recommend regular full body skin exams - Recommend daily broad spectrum sunscreen SPF 30+ to sun-exposed areas, reapply every 2 hours as needed.  - Call if any new or changing lesions are noted between office visits  Return in about 3 months (around 10/19/2021) for ak followup, milia f/up. I, Ruthell Rummage, CMA, am acting as scribe for Brendolyn Patty, MD.  Documentation: I have reviewed the above documentation for accuracy and completeness, and I agree with the above.  Brendolyn Patty MD

## 2021-10-24 ENCOUNTER — Ambulatory Visit (INDEPENDENT_AMBULATORY_CARE_PROVIDER_SITE_OTHER): Payer: Medicare Other | Admitting: Dermatology

## 2021-10-24 DIAGNOSIS — Z85828 Personal history of other malignant neoplasm of skin: Secondary | ICD-10-CM

## 2021-10-24 DIAGNOSIS — L219 Seborrheic dermatitis, unspecified: Secondary | ICD-10-CM | POA: Diagnosis not present

## 2021-10-24 DIAGNOSIS — L578 Other skin changes due to chronic exposure to nonionizing radiation: Secondary | ICD-10-CM | POA: Diagnosis not present

## 2021-10-24 DIAGNOSIS — L570 Actinic keratosis: Secondary | ICD-10-CM | POA: Diagnosis not present

## 2021-10-24 DIAGNOSIS — L820 Inflamed seborrheic keratosis: Secondary | ICD-10-CM | POA: Diagnosis not present

## 2021-10-24 DIAGNOSIS — T148XXA Other injury of unspecified body region, initial encounter: Secondary | ICD-10-CM

## 2021-10-24 NOTE — Patient Instructions (Addendum)
Zoryve Cream - Apply once a day to dry, scaly areas on nose/face.  Cryotherapy Aftercare  Wash gently with soap and water everyday.   Apply Vaseline and Band-Aid daily until healed.   Due to recent changes in healthcare laws, you may see results of your pathology and/or laboratory studies on MyChart before the doctors have had a chance to review them. We understand that in some cases there may be results that are confusing or concerning to you. Please understand that not all results are received at the same time and often the doctors may need to interpret multiple results in order to provide you with the best plan of care or course of treatment. Therefore, we ask that you please give [place] 2 business days to thoroughly review all your results before contacting the office for clarification. Should we see a critical lab result, you will be contacted sooner.   If You Need Anything After Your Visit  If you have any questions or concerns for your doctor, please call our main line at [phone] and press option 4 to reach your doctor's medical assistant. If no one answers, please leave a voicemail as directed and we will return your call as soon as possible. Messages left after 4 pm will be answered the following business day.   You may also send [place] a message via Outlook. We typically respond to MyChart messages within 1-2 business days.  For prescription refills, please ask your pharmacy to contact our office. Our fax number is [phone].  If you have an urgent issue when the clinic is closed that cannot wait until the next business day, you can page your doctor at the number below.    Please note that while we do our best to be available for urgent issues outside of office hours, we are not available 24/7.   If you have an urgent issue and are unable to reach [place], you may choose to seek medical care at your doctor's office, retail clinic, urgent care center, or emergency room.  If you have a  medical emergency, please immediately call 911 or go to the emergency department.  Pager Numbers  - Dr. Nehemiah Massed: [phone]  - Dr. Laurence Ferrari: [phone]  - Dr. Nicole Kindred: [phone]  In the event of inclement weather, please call our main line at [phone] for an update on the status of any delays or closures.  Dermatology Medication Tips: Please keep the boxes that topical medications come in in order to help keep track of the instructions about where and how to use these. Pharmacies typically print the medication instructions only on the boxes and not directly on the medication tubes.   If your medication is too expensive, please contact our office at [phone] option 4 or send [place] a message through [place].   We are unable to tell what your co-pay for medications will be in advance as this is different depending on your insurance coverage. However, we may be able to find a substitute medication at lower cost or fill out paperwork to get insurance to cover a needed medication.   If a prior authorization is required to get your medication covered by your insurance company, please allow [place] 1-2 business days to complete this process.  Drug prices often vary depending on where the prescription is filled and some pharmacies may offer cheaper prices.  The website www.goodrx.com contains coupons for medications through different pharmacies. The prices here do not account for what the cost may be with help from  insurance (it may be cheaper with your insurance), but the website can give you the price if you did not use any insurance.  - You can print the associated coupon and take it with your prescription to the pharmacy.  - You may also stop by our office during regular business hours and pick up a GoodRx coupon card.  - If you need your prescription sent electronically to a different pharmacy, notify our office through [facility] or by phone at [phone] option 4.     Si  Usted Necesita Algo Despus de Su Visita  Tambin puede enviarnos un mensaje a travs de Pharmacist, community. Por lo general respondemos a los mensajes de MyChart en el transcurso de 1 a 2 das hbiles.  Para renovar recetas, por favor pida a su farmacia que se ponga en contacto con nuestra oficina. Harland Dingwall de fax es [place] [phone].  Si tiene un asunto urgente cuando la clnica est cerrada y que no puede esperar hasta el siguiente da hbil, puede llamar/localizar a su doctor(a) al nmero que aparece a continuacin.   Por favor, tenga en cuenta que aunque hacemos todo lo posible para estar disponibles para asuntos urgentes fuera del horario de [place], no estamos disponibles las 24 horas del da, los 7 das de la [place].   Si tiene un problema urgente y no puede comunicarse con nosotros, puede optar por buscar atencin mdica  en el consultorio de su doctor(a), en una clnica privada, en un centro de atencin urgente o en una sala de emergencias.  Si tiene Engineering geologist, por favor llame inmediatamente al 911 o vaya a la sala de emergencias.  Nmeros de bper  - Dr. Nehemiah Massed: [phone]  - Dra. Moye: [phone]  - Dra. Nicole Kindred: [phone]  En caso de inclemencias del [place], por favor llame a Johnsie Kindred principal al [phone] para una actualizacin sobre el [place] de cualquier retraso o cierre.  Consejos para la medicacin en dermatologa: Por favor, guarde las cajas en las que vienen los medicamentos de uso tpico para ayudarle a seguir las instrucciones sobre dnde y cmo usarlos. Las farmacias generalmente imprimen las instrucciones del medicamento slo en las cajas y no directamente en los tubos del [employer].   Si su medicamento es muy caro, por favor, pngase en contacto con Zigmund Daniel llamando al [phone] y presione la opcin 4 o envenos un mensaje a travs de Pharmacist, community.   No podemos decirle cul ser su copago por los medicamentos por adelantado ya que  esto es diferente dependiendo de la cobertura de su seguro. Sin embargo, es posible que podamos encontrar un medicamento sustituto a Electrical engineer un formulario para que el seguro cubra el medicamento que se considera necesario.   Si se requiere una autorizacin previa para que su compaa de seguros [place] su medicamento, por favor permtanos de 1 a 2 das hbiles para completar este proceso.  Los precios de los medicamentos varan con frecuencia dependiendo del Environmental consultant de dnde se surte la receta y alguna farmacias pueden ofrecer precios ms baratos.  El sitio web www.goodrx.com tiene cupones para medicamentos de Airline pilot. Los precios aqu no tienen en cuenta lo que podra costar con la ayuda del seguro (puede ser ms barato con su seguro), pero el sitio web puede darle el precio si no utiliz Research scientist (physical sciences).  - Puede imprimir el cupn correspondiente y llevarlo con su receta a la farmacia.  - Tambin puede pasar por nuestra oficina durante el horario de  atencin regular y Charity fundraiser una tarjeta de cupones de GoodRx.  - Si necesita que su receta se enve electrnicamente a una farmacia diferente, informe a nuestra oficina a travs de MyChart de [place] o por telfono llamando al [phone] y presione la opcin 4.

## 2021-10-24 NOTE — Progress Notes (Signed)
Follow-Up Visit   Subjective  Caitlin Roy is a 81 y.o. female who presents for the following: Follow-up Aks. The patient has spots, moles and lesions to be evaluated, some may be new or changing and the patient has concerns that these could be cancer. Some are irritated.   The following portions of the chart were reviewed this encounter and updated as appropriate:       Review of Systems:  No other skin or systemic complaints except as noted in HPI or Assessment and Plan.  Objective  Well appearing patient in no apparent distress; mood and affect are within normal limits.  A focused examination was performed including face, arms, chest. Relevant physical exam findings are noted in the Assessment and Plan.  R med shoulder x 1, L sternum x 1, L forearm x 1 (3) Erythematous stuck-on, waxy papule or plaque  L med shoulder x 1, L hand x 5, R hand x 1 (7) Keratotic macules and papules  L nasal tip x 2, L upper eyebrow x 1, R eyebrow x 1 (4) Pink scaly macule  face Pink patches with greasy scale.   mid sternum Pink excoriated macule    Assessment & Plan  Actinic Damage - chronic, secondary to cumulative UV radiation exposure/sun exposure over time - diffuse scaly erythematous macules with underlying dyspigmentation - Recommend daily broad spectrum sunscreen SPF 30+ to sun-exposed areas, reapply every 2 hours as needed.  - Recommend staying in the shade or wearing long sleeves, sun glasses (UVA+UVB protection) and wide brim hats (4-inch brim around the entire circumference of the hat). - Call for new or changing lesions.  History of Basal Cell Carcinoma of the Skin - No evidence of recurrence today - Recommend regular full body skin exams - Recommend daily broad spectrum sunscreen SPF 30+ to sun-exposed areas, reapply every 2 hours as needed.  - Call if any new or changing lesions are noted between office visits  Inflamed seborrheic keratosis (3) R med shoulder x 1,  L sternum x 1, L forearm x 1  Symptomatic, irritating, patient would like treated.  Destruction of lesion - R med shoulder x 1, L sternum x 1, L forearm x 1  Destruction method: cryotherapy   Informed consent: discussed and consent obtained   Lesion destroyed using liquid nitrogen: Yes   Region frozen until ice ball extended beyond lesion: Yes   Outcome: patient tolerated procedure well with no complications   Post-procedure details: wound care instructions given   Additional details:  Prior to procedure, discussed risks of blister formation, small wound, skin dyspigmentation, or rare scar following cryotherapy. Recommend Vaseline ointment to treated areas while healing.   Related Medications mometasone (ELOCON) 0.1 % cream Apply 1 application topically daily as needed (Rash).  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  Actinic keratoses are precancerous spots that appear secondary to cumulative UV radiation exposure/sun exposure over time. They are chronic with expected duration over 1 year. A portion of actinic keratoses will progress to squamous cell carcinoma of the skin. It is not possible to reliably predict which spots will progress to skin cancer and so treatment is recommended to prevent development of skin cancer.  Recommend daily broad spectrum sunscreen SPF 30+ to sun-exposed areas, reapply every 2 hours as needed.  Recommend staying in the shade or wearing long sleeves, sun glasses (UVA+UVB protection) and wide brim  hats (4-inch brim around the entire circumference of the hat). Call for new or changing lesions.  Destruction of lesion - L med shoulder x 1, L hand x 5, R hand x 1  Destruction method: cryotherapy   Informed consent: discussed and consent obtained   Lesion destroyed using liquid nitrogen: Yes   Region frozen until ice ball extended beyond lesion: Yes   Outcome: patient tolerated procedure  well with no complications   Post-procedure details: wound care instructions given   Additional details:  Prior to procedure, discussed risks of blister formation, small wound, skin dyspigmentation, or rare scar following cryotherapy. Recommend Vaseline ointment to treated areas while healing.   AK (actinic keratosis) (4) L nasal tip x 2, L upper eyebrow x 1, R eyebrow x 1  Actinic keratoses are precancerous spots that appear secondary to cumulative UV radiation exposure/sun exposure over time. They are chronic with expected duration over 1 year. A portion of actinic keratoses will progress to squamous cell carcinoma of the skin. It is not possible to reliably predict which spots will progress to skin cancer and so treatment is recommended to prevent development of skin cancer.  Recommend daily broad spectrum sunscreen SPF 30+ to sun-exposed areas, reapply every 2 hours as needed.  Recommend staying in the shade or wearing long sleeves, sun glasses (UVA+UVB protection) and wide brim hats (4-inch brim around the entire circumference of the hat). Call for new or changing lesions.  Destruction of lesion - L nasal tip x 2, L upper eyebrow x 1, R eyebrow x 1  Destruction method: cryotherapy   Informed consent: discussed and consent obtained   Lesion destroyed using liquid nitrogen: Yes   Region frozen until ice ball extended beyond lesion: Yes   Outcome: patient tolerated procedure well with no complications   Post-procedure details: wound care instructions given   Additional details:  Prior to procedure, discussed risks of blister formation, small wound, skin dyspigmentation, or rare scar following cryotherapy. Recommend Vaseline ointment to treated areas while healing.   Seborrheic dermatitis face  Chronic and persistent condition with duration or expected duration over one year. Condition is symptomatic/ bothersome to patient. Not currently at goal.   Seborrheic Dermatitis  -  is a  chronic persistent rash characterized by pinkness and scaling most commonly of the mid face but also can occur on the scalp (dandruff), ears; mid chest, mid back and groin.  It tends to be exacerbated by stress and cooler weather.  People who have neurologic disease may experience new onset or exacerbation of existing seborrheic dermatitis.  The condition is not curable but treatable and can be controlled.  Start Zoryve Cream Apply to scaly areas nose/forehead/eyebrows QD. Samples x 2 given.  Decrease hydrocortisone cream and use only prn.   Excoriation mid sternum  Benign, observe.     Return in about 6 months (around 04/24/2022) for TBSE, Hx BCC, Hx AKs.  IJamesetta Orleans, CMA, am acting as scribe for Brendolyn Patty, MD .  Documentation: I have reviewed the above documentation for accuracy and completeness, and I agree with the above.  Brendolyn Patty MD

## 2021-11-03 ENCOUNTER — Other Ambulatory Visit: Payer: Self-pay | Admitting: Internal Medicine

## 2021-11-03 DIAGNOSIS — Z1231 Encounter for screening mammogram for malignant neoplasm of breast: Secondary | ICD-10-CM

## 2021-12-12 ENCOUNTER — Ambulatory Visit
Admission: RE | Admit: 2021-12-12 | Discharge: 2021-12-12 | Disposition: A | Discharge status: Home / Self Care | Payer: Medicare Other | Source: Ambulatory Visit | Attending: Internal Medicine | Admitting: Internal Medicine

## 2021-12-12 DIAGNOSIS — Z1231 Encounter for screening mammogram for malignant neoplasm of breast: Secondary | ICD-10-CM | POA: Diagnosis present

## 2022-04-25 ENCOUNTER — Ambulatory Visit (INDEPENDENT_AMBULATORY_CARE_PROVIDER_SITE_OTHER): Payer: Medicare Other | Admitting: Dermatology

## 2022-04-25 VITALS — BP 135/89 | HR 68

## 2022-04-25 DIAGNOSIS — Z1283 Encounter for screening for malignant neoplasm of skin: Secondary | ICD-10-CM | POA: Diagnosis not present

## 2022-04-25 DIAGNOSIS — D1801 Hemangioma of skin and subcutaneous tissue: Secondary | ICD-10-CM

## 2022-04-25 DIAGNOSIS — C44729 Squamous cell carcinoma of skin of left lower limb, including hip: Secondary | ICD-10-CM

## 2022-04-25 DIAGNOSIS — L820 Inflamed seborrheic keratosis: Secondary | ICD-10-CM | POA: Diagnosis not present

## 2022-04-25 DIAGNOSIS — D492 Neoplasm of unspecified behavior of bone, soft tissue, and skin: Secondary | ICD-10-CM

## 2022-04-25 DIAGNOSIS — C4492 Squamous cell carcinoma of skin, unspecified: Secondary | ICD-10-CM

## 2022-04-25 DIAGNOSIS — Z7189 Other specified counseling: Secondary | ICD-10-CM

## 2022-04-25 DIAGNOSIS — L570 Actinic keratosis: Secondary | ICD-10-CM | POA: Diagnosis not present

## 2022-04-25 DIAGNOSIS — Z85828 Personal history of other malignant neoplasm of skin: Secondary | ICD-10-CM

## 2022-04-25 DIAGNOSIS — L814 Other melanin hyperpigmentation: Secondary | ICD-10-CM

## 2022-04-25 DIAGNOSIS — L219 Seborrheic dermatitis, unspecified: Secondary | ICD-10-CM | POA: Diagnosis not present

## 2022-04-25 DIAGNOSIS — L304 Erythema intertrigo: Secondary | ICD-10-CM | POA: Diagnosis not present

## 2022-04-25 DIAGNOSIS — D229 Melanocytic nevi, unspecified: Secondary | ICD-10-CM

## 2022-04-25 DIAGNOSIS — L578 Other skin changes due to chronic exposure to nonionizing radiation: Secondary | ICD-10-CM | POA: Diagnosis not present

## 2022-04-25 DIAGNOSIS — L821 Other seborrheic keratosis: Secondary | ICD-10-CM

## 2022-04-25 HISTORY — DX: Squamous cell carcinoma of skin, unspecified: C44.92

## 2022-04-25 MED ORDER — KETOCONAZOLE 2 % EX CREA: 1.0000 | TOPICAL_CREAM | CUTANEOUS | 6 refills | Status: AC

## 2022-04-25 NOTE — Patient Instructions (Addendum)
Start Ketoconazole 2% cream once daily to rash under breast Start Desitin cream once to rash under breast or use Zeasorb AF powder Recommend OTC Zeasorb AF powder to body folds daily after shower.  It is often found in the athlete's foot section in the pharmacy.  Avoid using powders that contain cornstarch.   For itchy rash on face  Start Ketoconazole 2% cream once a day to itchy dry areas on face  Wound Care Instructions  Cleanse wound gently with soap and water once a day then pat dry with clean gauze. Apply a thin coat of Petrolatum (petroleum jelly, "Vaseline") over the wound (unless you have an allergy to this). We recommend that you use a new, sterile tube of Vaseline. Do not pick or remove scabs. Do not remove the yellow or white "healing tissue" from the base of the wound.  Cover the wound with fresh, clean, nonstick gauze and secure with paper tape. You may use Band-Aids in place of gauze and tape if the wound is small enough, but would recommend trimming much of the tape off as there is often too much. Sometimes Band-Aids can irritate the skin.  You should call the office for your biopsy report after 1 week if you have not already been contacted.  If you experience any problems, such as abnormal amounts of bleeding, swelling, significant bruising, significant pain, or evidence of infection, please call the office immediately.  FOR ADULT SURGERY PATIENTS: If you need something for pain relief you may take 1 extra strength Tylenol (acetaminophen) AND 2 Ibuprofen (200mg  each) together every 4 hours as needed for pain. (do not take these if you are allergic to them or if you have a reason you should not take them.) Typically, you may only need pain medication for 1 to 3 days.     Cryotherapy Aftercare  Wash gently with soap and water everyday.   Apply Vaseline and Band-Aid daily until healed.    Due to recent changes in healthcare laws, you may see results of your pathology and/or  laboratory studies on MyChart before the doctors have had a chance to review them. We understand that in some cases there may be results that are confusing or concerning to you. Please understand that not all results are received at the same time and often the doctors may need to interpret multiple results in order to provide you with the best plan of care or course of treatment. Therefore, we ask that you please give [place] 2 business days to thoroughly review all your results before contacting the office for clarification. Should we see a critical lab result, you will be contacted sooner.   If You Need Anything After Your Visit  If you have any questions or concerns for your doctor, please call our main line at [phone] and press option 4 to reach your doctor's medical assistant. If no one answers, please leave a voicemail as directed and we will return your call as soon as possible. Messages left after 4 pm will be answered the following business day.   You may also send [place] a message via [place]. We typically respond to MyChart messages within 1-2 business days.  For prescription refills, please ask your pharmacy to contact our office. Our fax number is [phone].  If you have an urgent issue when the clinic is closed that cannot wait until the next business day, you can page your doctor at the number below.    Please note that while we do  our best to be available for urgent issues outside of office hours, we are not available 24/7.   If you have an urgent issue and are unable to reach [place], you may choose to seek medical care at your doctor's office, retail clinic, urgent care center, or emergency room.  If you have a medical emergency, please immediately call 911 or go to the emergency department.  Pager Numbers  - Dr. Nehemiah Massed: [phone]  - Dr. Laurence Ferrari: [phone]  - Dr. Nicole Kindred: [phone]  In the event of inclement weather, please call our main line at [phone] for an  update on the status of any delays or closures.  Dermatology Medication Tips: Please keep the boxes that topical medications come in in order to help keep track of the instructions about where and how to use these. Pharmacies typically print the medication instructions only on the boxes and not directly on the medication tubes.   If your medication is too expensive, please contact our office at [phone] option 4 or send [place] a message through [place].   We are unable to tell what your co-pay for medications will be in advance as this is different depending on your insurance coverage. However, we may be able to find a substitute medication at lower cost or fill out paperwork to get insurance to cover a needed medication.   If a prior authorization is required to get your medication covered by your insurance company, please allow [place] 1-2 business days to complete this process.  Drug prices often vary depending on where the prescription is filled and some pharmacies may offer cheaper prices.  The website www.goodrx.com contains coupons for medications through different pharmacies. The prices here do not account for what the cost may be with help from insurance (it may be cheaper with your insurance), but the website can give you the price if you did not use any insurance.  - You can print the associated coupon and take it with your prescription to the pharmacy.  - You may also stop by our office during regular business hours and pick up a GoodRx coupon card.  - If you need your prescription sent electronically to a different pharmacy, notify our office through [facility] or by phone at [phone] option 4.     Si Usted Necesita Algo Despus de Su Visita  Tambin puede enviarnos un mensaje a travs de Pharmacist, community. Por lo general respondemos a los mensajes de MyChart en el transcurso de 1 a 2 das hbiles.  Para renovar recetas, por favor pida a su farmacia que se ponga en contacto con  nuestra oficina. Harland Dingwall de fax es [place] [phone].  Si tiene un asunto urgente cuando la clnica est cerrada y que no puede esperar hasta el siguiente da hbil, puede llamar/localizar a su doctor(a) al nmero que aparece a continuacin.   Por favor, tenga en cuenta que aunque hacemos todo lo posible para estar disponibles para asuntos urgentes fuera del horario de [place], no estamos disponibles las 24 horas del da, los 7 das de la [place].   Si tiene un problema urgente y no puede comunicarse con nosotros, puede optar por buscar atencin mdica  en el consultorio de su doctor(a), en una clnica privada, en un centro de atencin urgente o en una sala de emergencias.  Si tiene Engineering geologist, por favor llame inmediatamente al 911 o vaya a la sala de emergencias.  Nmeros de bper  - Dr. Nehemiah Massed: [phone]  - Dra. Moye: [phone]  -  Eliberto Ivory: [phone]  En caso de inclemencias del [facility], por favor llame a nuestra lnea principal al [phone] para una actualizacin sobre el [place] de cualquier retraso o cierre.  Consejos para la medicacin en dermatologa: Por favor, guarde las cajas en las que vienen los medicamentos de uso tpico para ayudarle a seguir las instrucciones sobre dnde y cmo usarlos. Las farmacias generalmente imprimen las instrucciones del medicamento slo en las cajas y no directamente en los tubos del [place].   Si su medicamento es muy caro, por favor, pngase en contacto con Zigmund Daniel llamando al [phone] y presione la opcin 4 o envenos un mensaje a travs de Pharmacist, community.   No podemos decirle cul ser su copago por los medicamentos por adelantado ya que esto es diferente dependiendo de la cobertura de su seguro. Sin embargo, es posible que podamos encontrar un medicamento sustituto a Electrical engineer un formulario para que el seguro cubra el medicamento que se considera necesario.   Si se requiere una autorizacin  previa para que su compaa de seguros [place] su medicamento, por favor permtanos de 1 a 2 das hbiles para completar este proceso.  Los precios de los medicamentos varan con frecuencia dependiendo del Environmental consultant de dnde se surte la receta y alguna farmacias pueden ofrecer precios ms baratos.  El sitio web www.goodrx.com tiene cupones para medicamentos de Airline pilot. Los precios aqu no tienen en cuenta lo que podra costar con la ayuda del seguro (puede ser ms barato con su seguro), pero el sitio web puede darle el precio si no utiliz Research scientist (physical sciences).  - Puede imprimir el cupn correspondiente y llevarlo con su receta a la farmacia.  - Tambin puede pasar por nuestra oficina durante el horario de atencin regular y Charity fundraiser una tarjeta de cupones de GoodRx.  - Si necesita que su receta se enve electrnicamente a una farmacia diferente, informe a nuestra oficina a travs de MyChart de [place] o por telfono llamando al [phone] y presione la opcin 4.

## 2022-04-25 NOTE — Progress Notes (Signed)
Follow-Up Visit   Subjective  Caitlin Roy is a 82 y.o. female who presents for the following: Total body skin exam (Hx of BCCs, AKs), Recheck Hypertrophic AK (L med shoulder, 87m f/u, LN2 at last visit), Seborrheic Dermatitis (Face, using otc Cortisone 10, we gave her samples of Zoryve cream at last visit), and check spot (L thigh, ~50m, itchy/Chest, 36m, itchy).  The patient presents for Total-Body Skin Exam (TBSE) for skin cancer screening and mole check.  The patient has spots, moles and lesions to be evaluated, some may be new or changing and the patient has concerns that these could be cancer.   The following portions of the chart were reviewed this encounter and updated as appropriate:       Review of Systems:  No other skin or systemic complaints except as noted in HPI or Assessment and Plan.  Objective  Well appearing patient in no apparent distress; mood and affect are within normal limits.  A full examination was performed including scalp, head, eyes, ears, nose, lips, neck, chest, axillae, abdomen, back, buttocks, bilateral upper extremities, bilateral lower extremities, hands, feet, fingers, toes, fingernails, and toenails. All findings within normal limits unless otherwise noted below.  face Mild scale forehead  L chest x 4, L shoulder x 1, L mid upper back x 3, L abdomen x 2 (10) Stuck on waxy paps with erythema  spinal upper back x 3 Pink excoriated paps  Scaly macules face  L ant thigh 7.65mm pink scaly pap     L lat upper thigh x 1 Keratotic papule  bil inframammary Mild erythema inframammary    Assessment & Plan  Seborrheic dermatitis face  /Psoriasis.  Chronic condition with duration or expected duration over one year. Currently well-controlled.   Seborrheic Dermatitis  -  is a chronic persistent rash characterized by pinkness and scaling most commonly of the mid face but also can occur on the scalp (dandruff), ears; mid chest, mid back and  groin.  It tends to be exacerbated by stress and cooler weather.  People who have neurologic disease may experience new onset or exacerbation of existing seborrheic dermatitis.  The condition is not curable but treatable and can be controlled.  May start Ketoconazole 2% cream to face qd as maintenance Continue HC 1% cream qd/bid to aas face prn flares  Inflamed seborrheic keratosis (10) L chest x 4, L shoulder x 1, L mid upper back x 3, L abdomen x 2  Symptomatic, irritating, patient would like treated.   Destruction of lesion - L chest x 4, L shoulder x 1, L mid upper back x 3, L abdomen x 2  Destruction method: cryotherapy   Informed consent: discussed and consent obtained   Lesion destroyed using liquid nitrogen: Yes   Region frozen until ice ball extended beyond lesion: Yes   Outcome: patient tolerated procedure well with no complications   Post-procedure details: wound care instructions given   Additional details:  Prior to procedure, discussed risks of blister formation, small wound, skin dyspigmentation, or rare scar following cryotherapy. Recommend Vaseline ointment to treated areas while healing.   Related Medications mometasone (ELOCON) 0.1 % cream Apply 1 application topically daily as needed (Rash).  AK (actinic keratosis) spinal upper back x 3  Vs ISKs back, recheck on f/u  Aks face, plan Red light treatment with debridement  Actinic keratoses are precancerous spots that appear secondary to cumulative UV radiation exposure/sun exposure over time. They are chronic with expected duration  over 1 year. A portion of actinic keratoses will progress to squamous cell carcinoma of the skin. It is not possible to reliably predict which spots will progress to skin cancer and so treatment is recommended to prevent development of skin cancer.  Recommend daily broad spectrum sunscreen SPF 30+ to sun-exposed areas, reapply every 2 hours as needed.  Recommend staying in the shade or  wearing long sleeves, sun glasses (UVA+UVB protection) and wide brim hats (4-inch brim around the entire circumference of the hat). Call for new or changing lesions.   Destruction of lesion - spinal upper back x 3  Destruction method: cryotherapy   Informed consent: discussed and consent obtained   Lesion destroyed using liquid nitrogen: Yes   Region frozen until ice ball extended beyond lesion: Yes   Outcome: patient tolerated procedure well with no complications   Post-procedure details: wound care instructions given   Additional details:  Prior to procedure, discussed risks of blister formation, small wound, skin dyspigmentation, or rare scar following cryotherapy. Recommend Vaseline ointment to treated areas while healing.   Neoplasm of skin L ant thigh  Epidermal / dermal shaving  Lesion diameter (cm):  0.7 Informed consent: discussed and consent obtained   Patient was prepped and draped in usual sterile fashion: area prepped with alcohol. Anesthesia: the lesion was anesthetized in a standard fashion   Anesthetic:  1% lidocaine w/ epinephrine 1-100,000 buffered w/ 8.4% NaHCO3 Instrument used: flexible razor blade   Hemostasis achieved with: pressure, aluminum chloride and electrodesiccation   Outcome: patient tolerated procedure well    Destruction of lesion  Destruction method: electrodesiccation and curettage   Informed consent: discussed and consent obtained   Curettage performed in three different directions: Yes   Electrodesiccation performed over the curetted area: Yes   Final wound size (cm):  0.8 Hemostasis achieved with:  pressure, aluminum chloride and electrodesiccation Outcome: patient tolerated procedure well with no complications   Post-procedure details: wound care instructions given   Additional details:  Mupirocin ointment and Bandaid applied   Specimen 1 - Surgical pathology Differential Diagnosis: D48.5 Hypertrophic AK R/O SCC  Check Margins:  yes 7.34mm pink scaly pap EDC  Hypertrophic actinic keratosis L lat upper thigh x 1  Destruction of lesion - L lat upper thigh x 1  Destruction method: cryotherapy   Informed consent: discussed and consent obtained   Lesion destroyed using liquid nitrogen: Yes   Region frozen until ice ball extended beyond lesion: Yes   Outcome: patient tolerated procedure well with no complications   Post-procedure details: wound care instructions given   Additional details:  Prior to procedure, discussed risks of blister formation, small wound, skin dyspigmentation, or rare scar following cryotherapy. Recommend Vaseline ointment to treated areas while healing.   Intertrigo bil inframammary  Chronic and persistent condition with duration or expected duration over one year. Condition is symptomatic/ bothersome to patient. Not currently at goal.   Intertrigo is a chronic recurrent rash that occurs in skin fold areas that may be associated with friction; heat; moisture; yeast; fungus; and bacteria.  It is exacerbated by increased movement / activity; sweating; and higher atmospheric temperature.  Start Ketoconazole 2% cr qd to rash under breast Start Desitin cr qd to rash under breast as a skin protectant  Or can use Zeasorb AF powder to keep area dry  ketoconazole (NIZORAL) 2 % cream - bil inframammary Apply 1 Application topically as directed. Apply to rash under breast qd prn flares   Lentigines -  Scattered tan macules - Due to sun exposure - Benign-appearing, observe - Recommend daily broad spectrum sunscreen SPF 30+ to sun-exposed areas, reapply every 2 hours as needed. - Call for any changes - arms  Seborrheic Keratoses - Stuck-on, waxy, tan-brown papules and/or plaques  - Benign-appearing - Discussed benign etiology and prognosis. - Observe - Call for any changes - arms  Melanocytic Nevi - Tan-brown and/or pink-flesh-colored symmetric macules and papules - Benign appearing on  exam today - Observation - Call clinic for new or changing moles - Recommend daily use of broad spectrum spf 30+ sunscreen to sun-exposed areas.   Hemangiomas - Red papules - Discussed benign nature - Observe - Call for any changes  Actinic Damage with PreCancerous Actinic Keratoses Counseling for Topical Chemotherapy Management: Patient exhibits: - Severe, confluent actinic changes with pre-cancerous actinic keratoses that is secondary to cumulative UV radiation exposure over time - Condition that is severe; chronic, not at goal. - diffuse scaly erythematous macules and papules with underlying dyspigmentation - Discussed Prescription "Field Treatment" topical Chemotherapy for Severe, Chronic Confluent Actinic Changes with Pre-Cancerous Actinic Keratoses Field treatment involves treatment of an entire area of skin that has confluent Actinic Changes (Sun/ Ultraviolet light damage) and PreCancerous Actinic Keratoses by method of PhotoDynamic Therapy (PDT) and/or prescription Topical Chemotherapy agents such as 5-fluorouracil, 5-fluorouracil/calcipotriene, and/or imiquimod.  The purpose is to decrease the number of clinically evident and subclinical PreCancerous lesions to prevent progression to development of skin cancer by chemically destroying early precancer changes that may or may not be visible.  It has been shown to reduce the risk of developing skin cancer in the treated area. As a result of treatment, redness, scaling, crusting, and open sores may occur during treatment course. One or more than one of these methods may be used and may have to be used several times to control, suppress and eliminate the PreCancerous changes. Discussed treatment course, expected reaction, and possible side effects. - Recommend daily broad spectrum sunscreen SPF 30+ to sun-exposed areas, reapply every 2 hours as needed.  - Staying in the shade or wearing long sleeves, sun glasses (UVA+UVB protection) and wide  brim hats (4-inch brim around the entire circumference of the hat) are also recommended. - Call for new or changing lesions.  - Recommend Red light treatment with debridement to face for AKs  Skin cancer screening performed today.   History of Basal Cell Carcinoma of the Skin - No evidence of recurrence today - Recommend regular full body skin exams - Recommend daily broad spectrum sunscreen SPF 30+ to sun-exposed areas, reapply every 2 hours as needed.  - Call if any new or changing lesions are noted between office visits  - Multiple  Return for Red light with debridement face, 19m AK f/u, ISK f/u, Hypertrophic AK f/u, .  I, Sonya Hupman, RMA, am acting as scribe for Brendolyn Patty, MD .  Documentation: I have reviewed the above documentation for accuracy and completeness, and I agree with the above.  Brendolyn Patty MD

## 2022-05-01 ENCOUNTER — Telehealth: Payer: Self-pay

## 2022-05-01 NOTE — Telephone Encounter (Signed)
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

## 2022-05-01 NOTE — Telephone Encounter (Signed)
Left pt msg to call for bx results/sh 

## 2022-05-01 NOTE — Telephone Encounter (Signed)
Patient advised pathology results showed SCC, already treated with EDC. Lurlean Horns., RMA

## 2022-05-30 ENCOUNTER — Ambulatory Visit (INDEPENDENT_AMBULATORY_CARE_PROVIDER_SITE_OTHER): Payer: Medicare Other | Admitting: Dermatology

## 2022-05-30 DIAGNOSIS — L570 Actinic keratosis: Secondary | ICD-10-CM

## 2022-05-30 MED ORDER — AMINOLEVULINIC ACID HCL 10 % EX GEL
2000.0000 mg | Freq: Once | CUTANEOUS | Status: AC
  Administered 2022-05-30: 2000 mg via TOPICAL

## 2022-05-30 NOTE — Patient Instructions (Signed)

## 2022-05-30 NOTE — Progress Notes (Signed)
Patient completed red light phototherapy today with debridement.  ACTINIC KERATOSES Exam: Erythematous thin papules/macules with gritty scale.  Treatment Plan:  Red Light Photodynamic therapy  Procedure discussed: discussed risks, benefits, side effects. and alternatives   Prep: site scrubbed/prepped with acetone   Debridement needed: Yes (performed by Physician with sand paper.  (CPT [id number])) Location:  face Number of lesions:  Multiple (> 15) Type of treatment:  Red light Aminolevulinic Acid (see MAR for details): Ameluz Aminolevulinic Acid comment:  [id number] Amount of Ameluz (mg):  1 Incubation time (minutes):  60 Number of minutes under lamp:  20 Cooling:  Fan Outcome: patient tolerated procedure well with no complications   Post-procedure details: sunscreen applied and aftercare instructions given to patient    Related Medications Aminolevulinic Acid HCl 10 % GEL 2,000 mg  [place] Desanctis  I personally debrided area prior to application of aminolevulinic acid. Willeen Niece MD  Documentation: I have reviewed the above documentation for accuracy and completeness, and I agree with the above.  Willeen Niece MD  ASC-NURSE ROOM

## 2022-07-06 IMAGING — MG MM DIGITAL SCREENING BILAT W/ TOMO AND CAD
6 of 10 series · 6 of 30 positions shown · non-contrast
Comparison: Previous exam(s).

CLINICAL DATA: Screening.

EXAM:
DIGITAL SCREENING BILATERAL MAMMOGRAM WITH TOMOSYNTHESIS AND CAD
TECHNIQUE: Bilateral screening digital craniocaudal and mediolateral oblique
mammograms were obtained. Bilateral screening digital breast
tomosynthesis was performed. The images were evaluated with
computer-aided detection.

[L MLO synth-2D (1 of 2)]
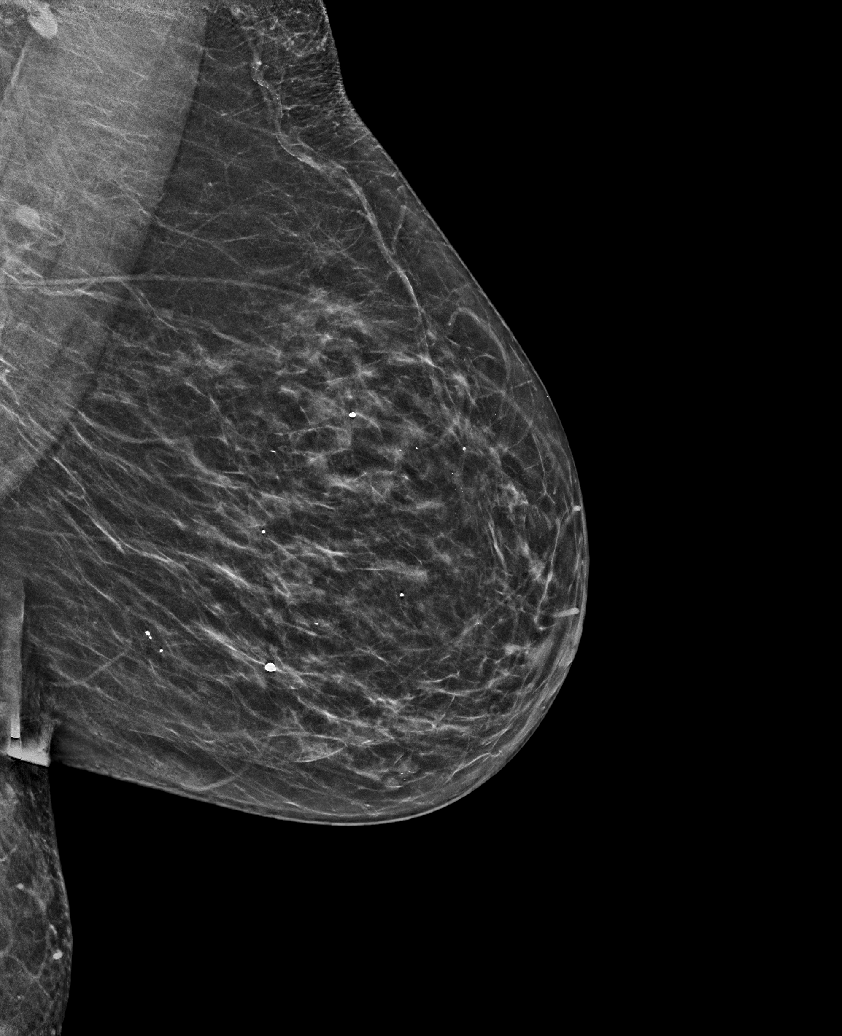

[L MLO synth-2D (2 of 2)]
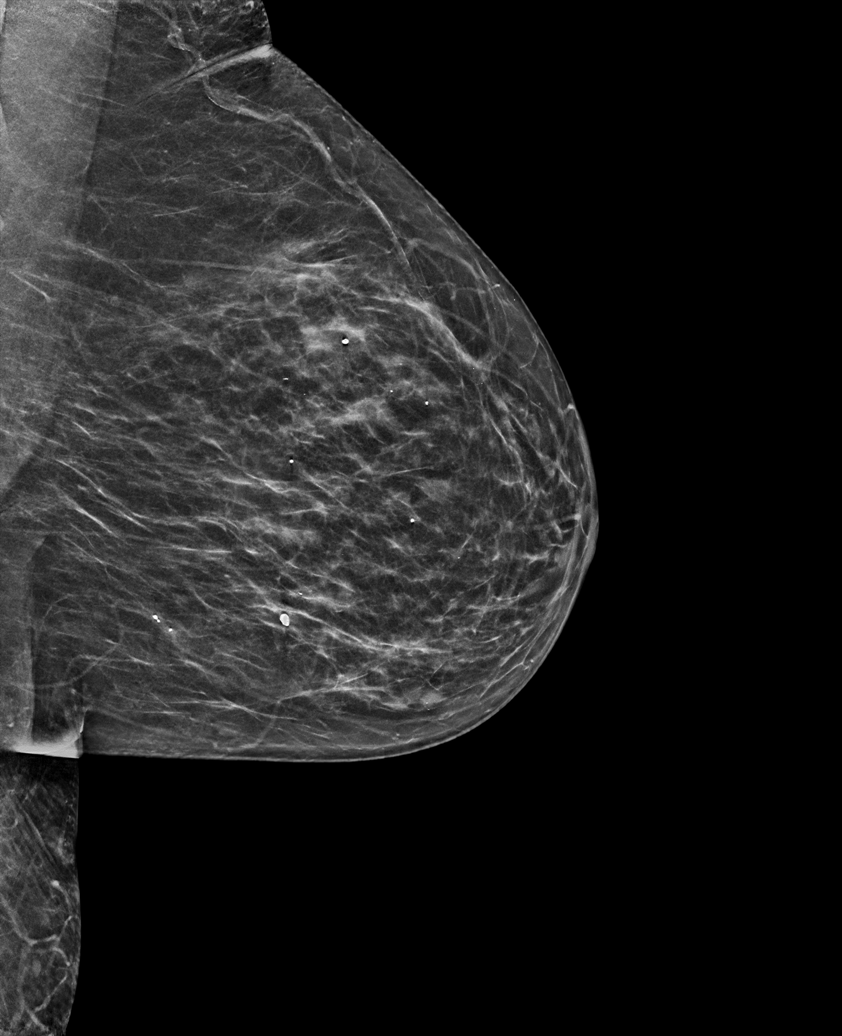

[R CC synth-2D]
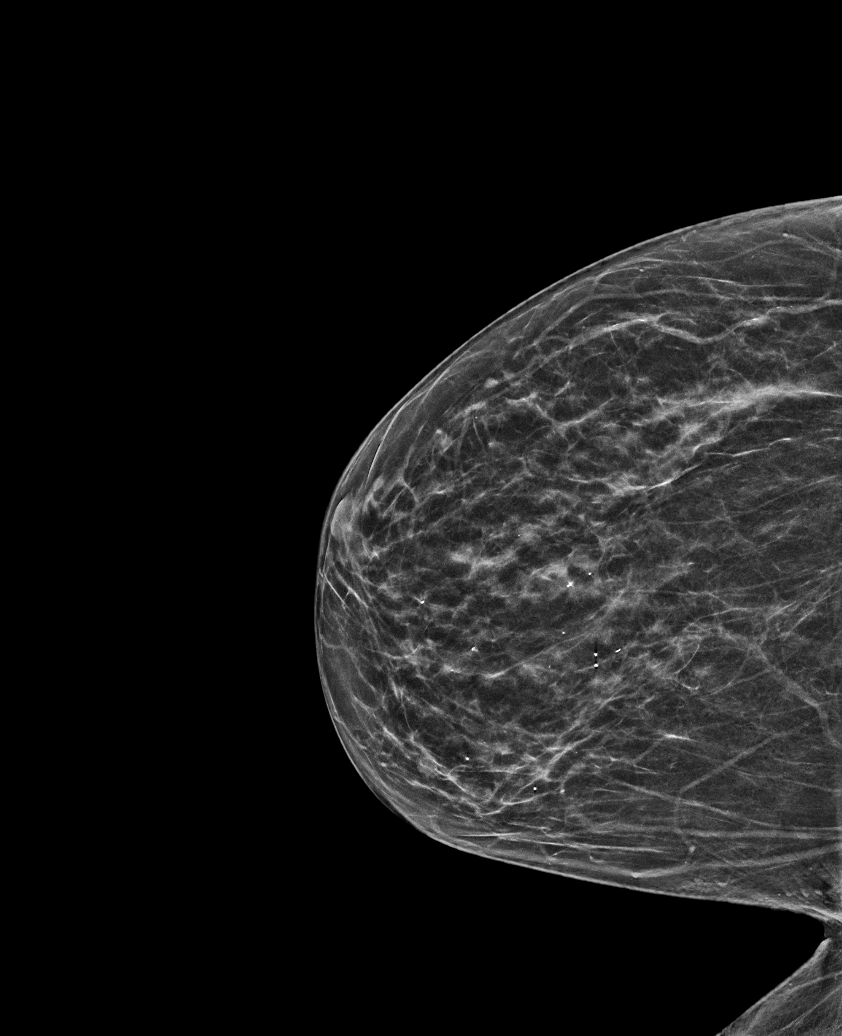

[R MLO synth-2D]
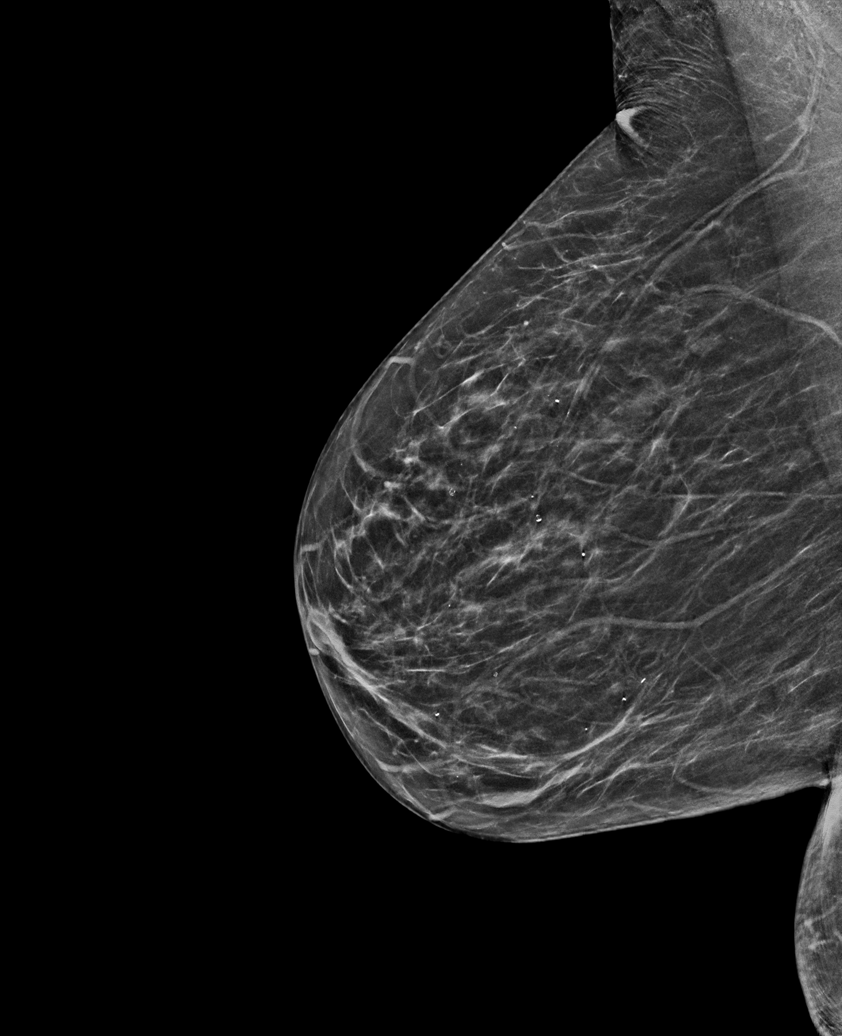

[L CC synth-2D]
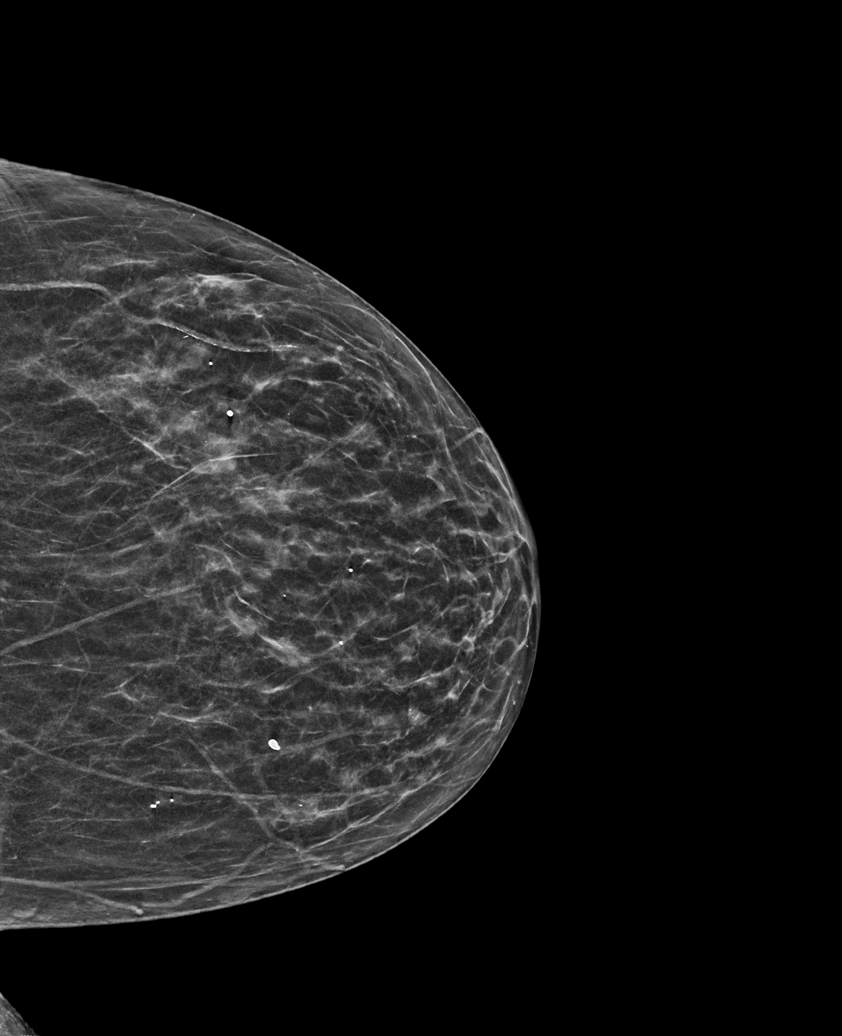

[R MLO tomo · tomo slice 27/53.0]
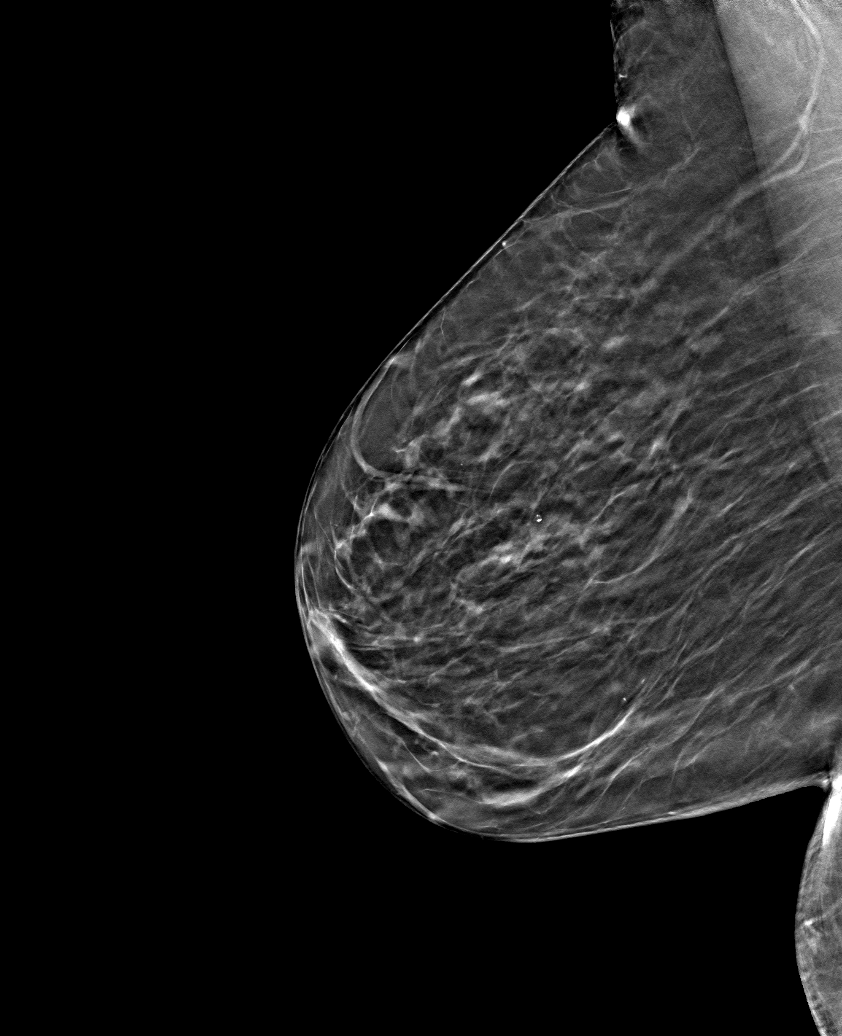

[6 of 30 positions shown; findings below may reference images not displayed]

ACR Breast Density Category b: There are scattered areas of
fibroglandular density.
FINDINGS: There are no findings suspicious for malignancy.
IMPRESSION: No mammographic evidence of malignancy. A result letter of this
screening mammogram will be mailed directly to the patient.

RECOMMENDATION:
Screening mammogram in one year. (Code:51-O-LD2)

BI-RADS CATEGORY  1: Negative.

## 2022-08-01 ENCOUNTER — Encounter: Payer: Self-pay | Admitting: Dermatology

## 2022-08-01 ENCOUNTER — Ambulatory Visit (INDEPENDENT_AMBULATORY_CARE_PROVIDER_SITE_OTHER): Payer: Medicare Other | Admitting: Dermatology

## 2022-08-01 VITALS — BP 128/74 | HR 68

## 2022-08-01 DIAGNOSIS — Z85828 Personal history of other malignant neoplasm of skin: Secondary | ICD-10-CM

## 2022-08-01 DIAGNOSIS — L570 Actinic keratosis: Secondary | ICD-10-CM | POA: Diagnosis not present

## 2022-08-01 DIAGNOSIS — L820 Inflamed seborrheic keratosis: Secondary | ICD-10-CM | POA: Diagnosis not present

## 2022-08-01 DIAGNOSIS — L821 Other seborrheic keratosis: Secondary | ICD-10-CM

## 2022-08-01 DIAGNOSIS — L578 Other skin changes due to chronic exposure to nonionizing radiation: Secondary | ICD-10-CM | POA: Diagnosis not present

## 2022-08-01 DIAGNOSIS — L720 Epidermal cyst: Secondary | ICD-10-CM | POA: Diagnosis not present

## 2022-08-01 DIAGNOSIS — W908XXA Exposure to other nonionizing radiation, initial encounter: Secondary | ICD-10-CM

## 2022-08-01 DIAGNOSIS — L814 Other melanin hyperpigmentation: Secondary | ICD-10-CM

## 2022-08-01 NOTE — Patient Instructions (Addendum)
Cryotherapy Aftercare  Wash gently with soap and water everyday.   Apply Vaseline and Band-Aid daily until healed.   Recommend daily broad spectrum sunscreen SPF 30+ to sun-exposed areas, reapply every 2 hours as needed. Call for new or changing lesions.  Staying in the shade or wearing long sleeves, sun glasses (UVA+UVB protection) and wide brim hats (4-inch brim around the entire circumference of the hat) are also recommended for sun protection.    Seborrheic Keratosis  What causes seborrheic keratoses? Seborrheic keratoses are harmless, common skin growths that first appear during adult life.  As time goes by, more growths appear.  Some people may develop a large number of them.  Seborrheic keratoses appear on both covered and uncovered body parts.  They are not caused by sunlight.  The tendency to develop seborrheic keratoses can be inherited.  They vary in color from skin-colored to gray, brown, or even black.  They can be either smooth or have a rough, warty surface.   Seborrheic keratoses are superficial and look as if they were stuck on the skin.  Under the microscope this type of keratosis looks like layers upon layers of skin.  That is why at times the top layer may seem to fall off, but the rest of the growth remains and re-grows.    Treatment Seborrheic keratoses do not need to be treated, but can easily be removed in the office.  Seborrheic keratoses often cause symptoms when they rub on clothing or jewelry.  Lesions can be in the way of shaving.  If they become inflamed, they can cause itching, soreness, or burning.  Removal of a seborrheic keratosis can be accomplished by freezing, burning, or surgery. If any spot bleeds, scabs, or grows rapidly, please return to have it checked, as these can be an indication of a skin cancer.   Due to recent changes in healthcare laws, you may see results of your pathology and/or laboratory studies on MyChart before the doctors have had a chance  to review them. We understand that in some cases there may be results that are confusing or concerning to you. Please understand that not all results are received at the same time and often the doctors may need to interpret multiple results in order to provide you with the best plan of care or course of treatment. Therefore, we ask that you please give us 2 business days to thoroughly review all your results before contacting the office for clarification. Should we see a critical lab result, you will be contacted sooner.   If You Need Anything After Your Visit  If you have any questions or concerns for your doctor, please call our main line at [phone] and press option 4 to reach your doctor's medical assistant. If no one answers, please leave a voicemail as directed and we will return your call as soon as possible. Messages left after 4 pm will be answered the following business day.   You may also send us a message via MyChart. We typically respond to MyChart messages within 1-2 business days.  For prescription refills, please ask your pharmacy to contact our office. Our fax number is [phone].  If you have an urgent issue when the clinic is closed that cannot wait until the next business day, you can page your doctor at the number below.    Please note that while we do our best to be available for urgent issues outside of office hours, we are not available 24/7.     If you have an urgent issue and are unable to reach us, you may choose to seek medical care at your doctor's office, retail clinic, urgent care center, or emergency room.  If you have a medical emergency, please immediately call 911 or go to the emergency department.  Pager Numbers  - Dr. Kowalski: [phone]  - Dr. Moye: [phone]  - Dr. Stewart: [phone]  In the event of inclement weather, please call our main line at [phone] for an update on the status of any delays or closures.  Dermatology  Medication Tips: Please keep the boxes that topical medications come in in order to help keep track of the instructions about where and how to use these. Pharmacies typically print the medication instructions only on the boxes and not directly on the medication tubes.   If your medication is too expensive, please contact our office at [phone] option 4 or send us a message through MyChart.   We are unable to tell what your co-pay for medications will be in advance as this is different depending on your insurance coverage. However, we may be able to find a substitute medication at lower cost or fill out paperwork to get insurance to cover a needed medication.   If a prior authorization is required to get your medication covered by your insurance company, please allow us 1-2 business days to complete this process.  Drug prices often vary depending on where the prescription is filled and some pharmacies may offer cheaper prices.  The website www.goodrx.com contains coupons for medications through different pharmacies. The prices here do not account for what the cost may be with help from insurance (it may be cheaper with your insurance), but the website can give you the price if you did not use any insurance.  - You can print the associated coupon and take it with your prescription to the pharmacy.  - You may also stop by our office during regular business hours and pick up a GoodRx coupon card.  - If you need your prescription sent electronically to a different pharmacy, notify our office through [facility] MyChart or by phone at [phone] option 4.     Si Usted Necesita Algo Despus de Su Visita  Tambin puede enviarnos un mensaje a travs de MyChart. Por lo general respondemos a los mensajes de MyChart en el transcurso de 1 a 2 das hbiles.  Para renovar recetas, por favor pida a su farmacia que se ponga en contacto con nuestra oficina. Nuestro nmero de fax es el [phone].  Si  tiene un asunto urgente cuando la clnica est cerrada y que no puede esperar hasta el siguiente da hbil, puede llamar/localizar a su doctor(a) al nmero que aparece a continuacin.   Por favor, tenga en cuenta que aunque hacemos todo lo posible para estar disponibles para asuntos urgentes fuera del horario de oficina, no estamos disponibles las 24 horas del da, los 7 das de la semana.   Si tiene un problema urgente y no puede comunicarse con nosotros, puede optar por buscar atencin mdica  en el consultorio de su doctor(a), en una clnica privada, en un centro de atencin urgente o en una sala de emergencias.  Si tiene una emergencia mdica, por favor llame inmediatamente al 911 o vaya a la sala de emergencias.  Nmeros de bper  - Dr. Kowalski: [phone]  - Dra. Moye: [phone]  - Dra. Stewart: [phone]  En caso de inclemencias del tiempo, por favor llame a nuestra lnea principal   al [phone] para una actualizacin sobre el estado de cualquier retraso o cierre.  Consejos para la medicacin en dermatologa: Por favor, guarde las cajas en las que vienen los medicamentos de uso tpico para ayudarle a seguir las instrucciones sobre dnde y cmo usarlos. Las farmacias generalmente imprimen las instrucciones del medicamento slo en las cajas y no directamente en los tubos del medicamento.   Si su medicamento es muy caro, por favor, pngase en contacto con nuestra oficina llamando al [phone] y presione la opcin 4 o envenos un mensaje a travs de MyChart.   No podemos decirle cul ser su copago por los medicamentos por adelantado ya que esto es diferente dependiendo de la cobertura de su seguro. Sin embargo, es posible que podamos encontrar un medicamento sustituto a menor costo o llenar un formulario para que el seguro cubra el medicamento que se considera necesario.   Si se requiere una autorizacin previa para que su compaa de seguros cubra su medicamento, por favor  permtanos de 1 a 2 das hbiles para completar este proceso.  Los precios de los medicamentos varan con frecuencia dependiendo del lugar de dnde se surte la receta y alguna farmacias pueden ofrecer precios ms baratos.  El sitio web www.goodrx.com tiene cupones para medicamentos de diferentes farmacias. Los precios aqu no tienen en cuenta lo que podra costar con la ayuda del seguro (puede ser ms barato con su seguro), pero el sitio web puede darle el precio si no utiliz ningn seguro.  - Puede imprimir el cupn correspondiente y llevarlo con su receta a la farmacia.  - Tambin puede pasar por nuestra oficina durante el horario de atencin regular y recoger una tarjeta de cupones de GoodRx.  - Si necesita que su receta se enve electrnicamente a una farmacia diferente, informe a nuestra oficina a travs de MyChart de [facility] o por telfono llamando al [phone] y presione la opcin 4.  

## 2022-08-01 NOTE — Progress Notes (Signed)
Follow-Up Visit   Subjective  Caitlin Roy is a 82 y.o. female who presents for the following: 3 month AK follow up. S/P PDT treatment on face. Patient reports was very red and painful after treatment. States was unable to sleep for several nights. Face feels more smooth now.  She also has some itchy, irritated spots on back to check.   The following portions of the chart were reviewed this encounter and updated as appropriate: medications, allergies, medical history  Review of Systems:  No other skin or systemic complaints except as noted in HPI or Assessment and Plan.  Objective  Well appearing patient in no apparent distress; mood and affect are within normal limits.  A focused examination was performed of the following areas: Face, neck, arms, back  Relevant exam findings are noted in the Assessment and Plan.  right upper forehead x1, left nasal tip x1, right lower nasal dorsum x1, right malar cheek x1 (4) Erythematous thin papules/macules with gritty scale.   back x5, right upper forehead x1, right upper arm x1 (7) Erythematous keratotic or waxy stuck-on papule or plaque.    Assessment & Plan   HISTORY OF SQUAMOUS CELL CARCINOMA OF THE SKIN - No evidence of recurrence today - No lymphadenopathy - Recommend regular full body skin exams - Recommend daily broad spectrum sunscreen SPF 30+ to sun-exposed areas, reapply every 2 hours as needed.  - Call if any new or changing lesions are noted between office visits   HISTORY OF BASAL CELL CARCINOMA OF THE SKIN - No evidence of recurrence today - Recommend regular full body skin exams - Recommend daily broad spectrum sunscreen SPF 30+ to sun-exposed areas, reapply every 2 hours as needed.  - Call if any new or changing lesions are noted between office visits   ACTINIC DAMAGE - chronic, secondary to cumulative UV radiation exposure/sun exposure over time - diffuse scaly erythematous macules with underlying  dyspigmentation - Recommend daily broad spectrum sunscreen SPF 30+ to sun-exposed areas, reapply every 2 hours as needed.  - Recommend staying in the shade or wearing long sleeves, sun glasses (UVA+UVB protection) and wide brim hats (4-inch brim around the entire circumference of the hat). - Call for new or changing lesions. - Good result with red light PDT with debridement to face  SEBORRHEIC KERATOSIS - Stuck-on, waxy, tan-brown papules and/or plaques  - Benign-appearing - Discussed benign etiology and prognosis. - Observe - Call for any changes  LENTIGINES Exam: scattered tan macules Due to sun exposure Treatment Plan: Benign-appearing, observe. Recommend daily broad spectrum sunscreen SPF 30+ to sun-exposed areas, reapply every 2 hours as needed.  Call for any changes  Milia - tiny firm white papules perioral - type of cyst - benign - may be extracted if symptomatic - observe   AK (actinic keratosis) (4) right upper forehead x1, left nasal tip x1, right lower nasal dorsum x1, right malar cheek x1  Actinic keratoses are precancerous spots that appear secondary to cumulative UV radiation exposure/sun exposure over time. They are chronic with expected duration over 1 year. A portion of actinic keratoses will progress to squamous cell carcinoma of the skin. It is not possible to reliably predict which spots will progress to skin cancer and so treatment is recommended to prevent development of skin cancer.  Recommend daily broad spectrum sunscreen SPF 30+ to sun-exposed areas, reapply every 2 hours as needed.  Recommend staying in the shade or wearing long sleeves, sun glasses (UVA+UVB protection) and wide  brim hats (4-inch brim around the entire circumference of the hat). Call for new or changing lesions.  Destruction of lesion - right upper forehead x1, left nasal tip x1, right lower nasal dorsum x1, right malar cheek x1  Destruction method: cryotherapy   Informed consent:  discussed and consent obtained   Lesion destroyed using liquid nitrogen: Yes   Region frozen until ice ball extended beyond lesion: Yes   Outcome: patient tolerated procedure well with no complications   Post-procedure details: wound care instructions given   Additional details:  Prior to procedure, discussed risks of blister formation, small wound, skin dyspigmentation, or rare scar following cryotherapy. Recommend Vaseline ointment to treated areas while healing.   Inflamed seborrheic keratosis (7) back x5, right upper forehead x1, right upper arm x1  Symptomatic, irritating, patient would like treated.    Destruction of lesion - back x5, right upper forehead x1, right upper arm x1  Destruction method: cryotherapy   Informed consent: discussed and consent obtained   Lesion destroyed using liquid nitrogen: Yes   Region frozen until ice ball extended beyond lesion: Yes   Outcome: patient tolerated procedure well with no complications   Post-procedure details: wound care instructions given   Additional details:  Prior to procedure, discussed risks of blister formation, small wound, skin dyspigmentation, or rare scar following cryotherapy. Recommend Vaseline ointment to treated areas while healing.   Related Medications mometasone (ELOCON) 0.1 % cream Apply 1 application topically daily as needed (Rash).    Return for TBSE, AK Follow Up in 6-7 months.  I, Lawson Radar, CMA, am acting as scribe for Willeen Niece, MD.   Documentation: I have reviewed the above documentation for accuracy and completeness, and I agree with the above.  Willeen Niece, MD

## 2022-08-15 ENCOUNTER — Other Ambulatory Visit: Payer: Self-pay | Admitting: Internal Medicine

## 2022-08-15 DIAGNOSIS — Z1231 Encounter for screening mammogram for malignant neoplasm of breast: Secondary | ICD-10-CM

## 2022-12-14 ENCOUNTER — Ambulatory Visit
Admission: RE | Admit: 2022-12-14 | Discharge: 2022-12-14 | Disposition: A | Discharge status: Home / Self Care | Payer: Medicare Other | Source: Ambulatory Visit | Attending: Internal Medicine | Admitting: Internal Medicine

## 2022-12-14 DIAGNOSIS — Z1231 Encounter for screening mammogram for malignant neoplasm of breast: Secondary | ICD-10-CM | POA: Diagnosis present

## 2023-02-13 ENCOUNTER — Ambulatory Visit: Payer: Medicare Other | Admitting: Dermatology

## 2023-02-13 DIAGNOSIS — C44519 Basal cell carcinoma of skin of other part of trunk: Secondary | ICD-10-CM

## 2023-02-13 DIAGNOSIS — L570 Actinic keratosis: Secondary | ICD-10-CM

## 2023-02-13 DIAGNOSIS — L814 Other melanin hyperpigmentation: Secondary | ICD-10-CM

## 2023-02-13 DIAGNOSIS — W908XXA Exposure to other nonionizing radiation, initial encounter: Secondary | ICD-10-CM | POA: Diagnosis not present

## 2023-02-13 DIAGNOSIS — D229 Melanocytic nevi, unspecified: Secondary | ICD-10-CM

## 2023-02-13 DIAGNOSIS — D1801 Hemangioma of skin and subcutaneous tissue: Secondary | ICD-10-CM

## 2023-02-13 DIAGNOSIS — L578 Other skin changes due to chronic exposure to nonionizing radiation: Secondary | ICD-10-CM

## 2023-02-13 DIAGNOSIS — L720 Epidermal cyst: Secondary | ICD-10-CM | POA: Diagnosis not present

## 2023-02-13 DIAGNOSIS — C44712 Basal cell carcinoma of skin of right lower limb, including hip: Secondary | ICD-10-CM

## 2023-02-13 DIAGNOSIS — L821 Other seborrheic keratosis: Secondary | ICD-10-CM

## 2023-02-13 DIAGNOSIS — Z1283 Encounter for screening for malignant neoplasm of skin: Secondary | ICD-10-CM | POA: Diagnosis not present

## 2023-02-13 DIAGNOSIS — Z85828 Personal history of other malignant neoplasm of skin: Secondary | ICD-10-CM

## 2023-02-13 DIAGNOSIS — L219 Seborrheic dermatitis, unspecified: Secondary | ICD-10-CM | POA: Diagnosis not present

## 2023-02-13 DIAGNOSIS — D489 Neoplasm of uncertain behavior, unspecified: Secondary | ICD-10-CM

## 2023-02-13 DIAGNOSIS — L820 Inflamed seborrheic keratosis: Secondary | ICD-10-CM | POA: Diagnosis not present

## 2023-02-13 NOTE — Progress Notes (Signed)
 Follow-Up Visit   Subjective  Caitlin Roy is a 83 y.o. female who presents for the following: Skin Cancer Screening and Full Body Skin Exam Hx of aks, hx of isk, hx of bcc,  Spots at left cheek, right cheek, trunk, neck, shoulders that get irritated.  The patient presents for Total-Body Skin Exam (TBSE) for skin cancer screening and mole check. The patient has spots, moles and lesions to be evaluated, some may be new or changing and the patient may have concern these could be cancer.    The following portions of the chart were reviewed this encounter and updated as appropriate: medications, allergies, medical history  Review of Systems:  No other skin or systemic complaints except as noted in HPI or Assessment and Plan.  Objective  Well appearing patient in no apparent distress; mood and affect are within normal limits.  A full examination was performed including scalp, head, eyes, ears, nose, lips, neck, chest, axillae, abdomen, back, buttocks, bilateral upper extremities, bilateral lower extremities, hands, feet, fingers, toes, fingernails, and toenails. All findings within normal limits unless otherwise noted below.   Relevant physical exam findings are noted in the Assessment and Plan.  left upper breast x 2, left neck x 1 (3), right medial cheek x 1, left nasal dorsum below scar x 1, right medial canthus x 1, right anterior shoulder x 1, left posterior shoulder x 1, right posterior shoulder x 1, left upper back x 1, right posterior flank x 1, left and right posterior neck x2, (10) Erythematous stuck-on, waxy papule or plaque left upper eyebrow x 1, left malar cheek x 1, left alar crease x 1, left lateral cheek x 2, spinal upper back x 4 (9) Erythematous thin papules/macules with gritty scale.  Right Thigh - Anterior 6 mm pink scaly papule   Left Lower Back 1.5 x 1 cm pink pearly patch    Assessment & Plan   SKIN CANCER SCREENING PERFORMED TODAY.  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 Currently well-controlled.    Seborrheic Dermatitis  -  is a chronic persistent rash characterized by pinkness and scaling most commonly of the mid face but also can occur on the scalp (dandruff), ears; mid chest, mid back and groin.  It tends to be exacerbated by stress and cooler weather.  People who have neurologic disease may experience new onset or exacerbation of existing seborrheic dermatitis.  The condition is not curable but treatable and can be controlled.   Continue  Ketoconazole  2% cream to face qd as maintenance Continue HC 1% cream qd/bid to aas face prn flares  Milia - tiny firm white papules at chin - type of cyst - benign - sometimes these will clear with nightly OTC adapalene/Differin 0.1% gel or retinol. - may be extracted if symptomatic - observe    ACTINIC DAMAGE - Chronic condition, secondary to cumulative UV/sun exposure - diffuse scaly erythematous macules with underlying dyspigmentation - Recommend daily broad spectrum sunscreen SPF 30+ to sun-exposed areas, reapply every 2 hours as needed.  - Staying in the shade or wearing long sleeves, sun glasses (UVA+UVB protection) and wide brim hats (4-inch brim around the entire circumference of the hat) are also recommended for sun protection.  - Call for new or changing lesions.  LENTIGINES, SEBORRHEIC KERATOSES, HEMANGIOMAS - Benign normal skin lesions - Benign-appearing - Call for any changes  MELANOCYTIC NEVI - Tan-brown and/or pink-flesh-colored symmetric  macules and papules - Benign appearing on exam today - Observation - Call clinic for new or changing moles - Recommend daily use of broad spectrum spf 30+ sunscreen to sun-exposed areas.   HISTORY OF BASAL CELL CARCINOMA OF THE SKIN Multiple locations  Most recent see history  Right medial eyebrow ED&C 1/23  - No evidence of recurrence today -  Recommend regular full body skin exams - Recommend daily broad spectrum sunscreen SPF 30+ to sun-exposed areas, reapply every 2 hours as needed.  - Call if any new or changing lesions are noted between office visits  HISTORY OF SQUAMOUS CELL CARCINOMA OF THE SKIN Left anterior thigh ED&C 04/2022 - No evidence of recurrence today - Recommend regular full body skin exams - Recommend daily broad spectrum sunscreen SPF 30+ to sun-exposed areas, reapply every 2 hours as needed.  - Call if any new or changing lesions are noted between office visits   INFLAMED SEBORRHEIC KERATOSIS (13) left upper breast x 2, left neck x 1 (3), right medial cheek x 1, left nasal dorsum below scar x 1, right medial canthus x 1, right anterior shoulder x 1, left posterior shoulder x 1, right posterior shoulder x 1, left upper back x 1, right posterior flank x 1, left and right posterior neck x2, (10) Symptomatic, irritating, patient would like treated. Destruction of lesion - left upper breast x 2, left neck x 1 (3), right medial cheek x 1, left nasal dorsum below scar x 1, right medial canthus x 1, right anterior shoulder x 1, left posterior shoulder x 1, right posterior shoulder x 1, left upper back x 1, right posterior flank x 1, left and right posterior neck x2, (10)  Destruction method: cryotherapy   Informed consent: discussed and consent obtained   Lesion destroyed using liquid nitrogen: Yes   Region frozen until ice ball extended beyond lesion: Yes   Outcome: patient tolerated procedure well with no complications   Post-procedure details: wound care instructions given   Additional details:  Prior to procedure, discussed risks of blister formation, small wound, skin dyspigmentation, or rare scar following cryotherapy. Recommend Vaseline ointment to treated areas while healing.  Related Medications mometasone  (ELOCON ) 0.1 % cream Apply 1 application topically daily as needed (Rash). ACTINIC KERATOSIS (9) left  upper eyebrow x 1, left malar cheek x 1, left alar crease x 1, left lateral cheek x 2, spinal upper back x 4 (9) Actinic keratoses are precancerous spots that appear secondary to cumulative UV radiation exposure/sun exposure over time. They are chronic with expected duration over 1 year. A portion of actinic keratoses will progress to squamous cell carcinoma of the skin. It is not possible to reliably predict which spots will progress to skin cancer and so treatment is recommended to prevent development of skin cancer.  Recommend daily broad spectrum sunscreen SPF 30+ to sun-exposed areas, reapply every 2 hours as needed.  Recommend staying in the shade or wearing long sleeves, sun glasses (UVA+UVB protection) and wide brim hats (4-inch brim around the entire circumference of the hat). Call for new or changing lesions. Destruction of lesion - left upper eyebrow x 1, left malar cheek x 1, left alar crease x 1, left lateral cheek x 2, spinal upper back x 4 (9)  Destruction method: cryotherapy   Informed consent: discussed and consent obtained   Lesion destroyed using liquid nitrogen: Yes   Region frozen until ice ball extended beyond lesion: Yes   Outcome: patient tolerated procedure well  with no complications   Post-procedure details: wound care instructions given   Additional details:  Prior to procedure, discussed risks of blister formation, small wound, skin dyspigmentation, or rare scar following cryotherapy. Recommend Vaseline ointment to treated areas while healing.  NEOPLASM OF UNCERTAIN BEHAVIOR (2) Right Thigh - Anterior Epidermal / dermal shaving  Lesion diameter (cm):  0.6 Informed consent: discussed and consent obtained   Patient was prepped and draped in usual sterile fashion: Area prepped with alcohol. Anesthesia: the lesion was anesthetized in a standard fashion   Anesthetic:  1% lidocaine  w/ epinephrine  1-100,000 buffered w/ 8.4% NaHCO3 Instrument used: flexible razor blade    Hemostasis achieved with: pressure, aluminum chloride and electrodesiccation   Outcome: patient tolerated procedure well    Destruction of lesion  Destruction method: electrodesiccation and curettage   Informed consent: discussed and consent obtained   Curettage performed in three different directions: Yes   Electrodesiccation performed over the curetted area: Yes   Final wound size (cm):  0.9 Hemostasis achieved with:  pressure, aluminum chloride and electrodesiccation Outcome: patient tolerated procedure well with no complications   Post-procedure details: wound care instructions given   Additional details:  Mupirocin ointment and Bandaid applied  Specimen 1 - Surgical pathology Differential Diagnosis: isk r/o scc   Check Margins: No Left Lower Back Epidermal / dermal shaving  Lesion diameter (cm):  1.5 Informed consent: discussed and consent obtained   Patient was prepped and draped in usual sterile fashion: Area prepped with alcohol. Anesthesia: the lesion was anesthetized in a standard fashion   Anesthetic:  1% lidocaine  w/ epinephrine  1-100,000 buffered w/ 8.4% NaHCO3 Instrument used: flexible razor blade   Hemostasis achieved with: pressure, aluminum chloride and electrodesiccation   Outcome: patient tolerated procedure well    Destruction of lesion  Destruction method: electrodesiccation and curettage   Informed consent: discussed and consent obtained   Curettage performed in three different directions: Yes   Electrodesiccation performed over the curetted area: Yes   Final wound size (cm):  2.5 Hemostasis achieved with:  pressure, aluminum chloride and electrodesiccation Outcome: patient tolerated procedure well with no complications   Post-procedure details: wound care instructions given   Additional details:  Mupirocin ointment and Bandaid applied  Specimen 2 - Surgical pathology Differential Diagnosis: r/o bcc   Check Margins: No ISK r/o SCC at right anterior  thigh  R/o bcc at left lower back  Return for 6 month ak follow up, 1 year tbse .  I, Eleanor Blush, CMA, am acting as scribe for Rexene Rattler, MD.   Documentation: I have reviewed the above documentation for accuracy and completeness, and I agree with the above.  Rexene Rattler, MD

## 2023-02-13 NOTE — Patient Instructions (Addendum)
 Biopsy Wound Care Instructions  Leave the original bandage on for 24 hours if possible.  If the bandage becomes soaked or soiled before that time, it is OK to remove it and examine the wound.  A small amount of post-operative bleeding is normal.  If excessive bleeding occurs, remove the bandage, place gauze over the site and apply continuous pressure (no peeking) over the area for 30 minutes. If this does not work, please call our clinic as soon as possible or page your doctor if it is after hours.   Once a day, cleanse the wound with soap and water. It is fine to shower. If a thick crust develops you may use a Q-tip dipped into dilute hydrogen peroxide (mix 1:1 with water) to dissolve it.  Hydrogen peroxide can slow the healing process, so use it only as needed.    After washing, apply petroleum jelly (Vaseline) or an antibiotic ointment if your doctor prescribed one for you, followed by a bandage.    For best healing, the wound should be covered with a layer of ointment at all times. If you are not able to keep the area covered with a bandage to hold the ointment in place, this may mean re-applying the ointment several times a day.  Continue this wound care until the wound has healed and is no longer open.   Itching and mild discomfort is normal during the healing process. However, if you develop pain or severe itching, please call our office.   If you have any discomfort, you can take Tylenol (acetaminophen) or ibuprofen as directed on the bottle. (Please do not take these if you have an allergy to them or cannot take them for another reason).  Some redness, tenderness and white or yellow material in the wound is normal healing.  If the area becomes very sore and red, or develops a thick yellow-green material (pus), it may be infected; please notify us.    If you have stitches, return to clinic as directed to have the stitches removed. You will continue wound care for 2-3 days after the stitches  are removed.   Wound healing continues for up to one year following surgery. It is not unusual to experience pain in the scar from time to time during the interval.  If the pain becomes severe or the scar thickens, you should notify the office.    A slight amount of redness in a scar is expected for the first six months.  After six months, the redness will fade and the scar will soften and fade.  The color difference becomes less noticeable with time.  If there are any problems, return for a post-op surgery check at your earliest convenience.  To improve the appearance of the scar, you can use silicone scar gel, cream, or sheets (such as Mederma or Serica) every night for up to one year. These are available over the counter (without a prescription).  Please call our office at [phone] for any questions or concerns.     Electrodesiccation and Curettage ("Scrape and Burn") Wound Care Instructions  Leave the original bandage on for 24 hours if possible.  If the bandage becomes soaked or soiled before that time, it is OK to remove it and examine the wound.  A small amount of post-operative bleeding is normal.  If excessive bleeding occurs, remove the bandage, place gauze over the site and apply continuous pressure (no peeking) over the area for 30 minutes. If this does not work, please  call our clinic as soon as possible or page your doctor if it is after hours.   Once a day, cleanse the wound with soap and water. It is fine to shower. If a thick crust develops you may use a Q-tip dipped into dilute hydrogen peroxide (mix 1:1 with water) to dissolve it.  Hydrogen peroxide can slow the healing process, so use it only as needed.    After washing, apply petroleum jelly (Vaseline) or an antibiotic ointment if your doctor prescribed one for you, followed by a bandage.    For best healing, the wound should be covered with a layer of ointment at all times. If you are not able to keep the area covered  with a bandage to hold the ointment in place, this may mean re-applying the ointment several times a day.  Continue this wound care until the wound has healed and is no longer open. It may take several weeks for the wound to heal and close.  Itching and mild discomfort is normal during the healing process.  If you have any discomfort, you can take Tylenol (acetaminophen) or ibuprofen as directed on the bottle. (Please do not take these if you have an allergy to them or cannot take them for another reason).  Some redness, tenderness and white or yellow material in the wound is normal healing.  If the area becomes very sore and red, or develops a thick yellow-green material (pus), it may be infected; please notify us.    Wound healing continues for up to one year following surgery. It is not unusual to experience pain in the scar from time to time during the interval.  If the pain becomes severe or the scar thickens, you should notify the office.    A slight amount of redness in a scar is expected for the first six months.  After six months, the redness will fade and the scar will soften and fade.  The color difference becomes less noticeable with time.  If there are any problems, return for a post-op surgery check at your earliest convenience.  To improve the appearance of the scar, you can use silicone scar gel, cream, or sheets (such as Mederma or Serica) every night for up to one year. These are available over the counter (without a prescription).  Please call our office at [phone] for any questions or concerns.    Actinic keratoses are precancerous spots that appear secondary to cumulative UV radiation exposure/sun exposure over time. They are chronic with expected duration over 1 year. A portion of actinic keratoses will progress to squamous cell carcinoma of the skin. It is not possible to reliably predict which spots will progress to skin cancer and so treatment is recommended to  prevent development of skin cancer.  Recommend daily broad spectrum sunscreen SPF 30+ to sun-exposed areas, reapply every 2 hours as needed.  Recommend staying in the shade or wearing long sleeves, sun glasses (UVA+UVB protection) and wide brim hats (4-inch brim around the entire circumference of the hat). Call for new or changing lesions.    Cryotherapy Aftercare  Wash gently with soap and water everyday.   Apply Vaseline and Band-Aid daily until healed.    Seborrheic Keratosis  What causes seborrheic keratoses? Seborrheic keratoses are harmless, common skin growths that first appear during adult life.  As time goes by, more growths appear.  Some people may develop a large number of them.  Seborrheic keratoses appear on both covered and uncovered body parts.  They are not caused by sunlight.  The tendency to develop seborrheic keratoses can be inherited.  They vary in color from skin-colored to gray, brown, or even black.  They can be either smooth or have a rough, warty surface.   Seborrheic keratoses are superficial and look as if they were stuck on the skin.  Under the microscope this type of keratosis looks like layers upon layers of skin.  That is why at times the top layer may seem to fall off, but the rest of the growth remains and re-grows.    Treatment Seborrheic keratoses do not need to be treated, but can easily be removed in the office.  Seborrheic keratoses often cause symptoms when they rub on clothing or jewelry.  Lesions can be in the way of shaving.  If they become inflamed, they can cause itching, soreness, or burning.  Removal of a seborrheic keratosis can be accomplished by freezing, burning, or surgery. If any spot bleeds, scabs, or grows rapidly, please return to have it checked, as these can be an indication of a skin cancer.     Melanoma ABCDEs  Melanoma is the most dangerous type of skin cancer, and is the leading cause of death from skin disease.  You are more  likely to develop melanoma if you: Have light-colored skin, light-colored eyes, or red or blond hair Spend a lot of time in the sun Tan regularly, either outdoors or in a tanning bed Have had blistering sunburns, especially during childhood Have a close family member who has had a melanoma Have atypical moles or large birthmarks  Early detection of melanoma is key since treatment is typically straightforward and cure rates are extremely high if we catch it early.   The first sign of melanoma is often a change in a mole or a new dark spot.  The ABCDE system is a way of remembering the signs of melanoma.  A for asymmetry:  The two halves do not match. B for border:  The edges of the growth are irregular. C for color:  A mixture of colors are present instead of an even brown color. D for diameter:  Melanomas are usually (but not always) greater than 6mm - the size of a pencil eraser. E for evolution:  The spot keeps changing in size, shape, and color.  Please check your skin once per month between visits. You can use a small mirror in front and a large mirror behind you to keep an eye on the back side or your body.   If you see any new or changing lesions before your next follow-up, please call to schedule a visit.  Please continue daily skin protection including broad spectrum sunscreen SPF 30+ to sun-exposed areas, reapplying every 2 hours as needed when you're outdoors.   Staying in the shade or wearing long sleeves, sun glasses (UVA+UVB protection) and wide brim hats (4-inch brim around the entire circumference of the hat) are also recommended for sun protection.    Due to recent changes in healthcare laws, you may see results of your pathology and/or laboratory studies on MyChart before the doctors have had a chance to review them. We understand that in some cases there may be results that are confusing or concerning to you. Please understand that not all results are received at the same  time and often the doctors may need to interpret multiple results in order to provide you with the best plan of care or course of treatment. Therefore,  we ask that you please give [place] 2 business days to thoroughly review all your results before contacting the office for clarification. Should we see a critical lab result, you will be contacted sooner.   If You Need Anything After Your Visit  If you have any questions or concerns for your doctor, please call our main line at [phone] and press option 4 to reach your doctor's medical assistant. If no one answers, please leave a voicemail as directed and we will return your call as soon as possible. Messages left after 4 pm will be answered the following business day.   You may also send [place] a message via MyChart. We typically respond to MyChart messages within 1-2 business days.  For prescription refills, please ask your pharmacy to contact our office. Our fax number is [phone].  If you have an urgent issue when the clinic is closed that cannot wait until the next business day, you can page your doctor at the number below.    Please note that while we do our best to be available for urgent issues outside of office hours, we are not available 24/7.   If you have an urgent issue and are unable to reach [place], you may choose to seek medical care at your doctor's office, retail clinic, urgent care center, or emergency room.  If you have a medical emergency, please immediately call 911 or go to the emergency department.  Pager Numbers  - Dr. Gwen Pounds: [phone]  - Dr. Roseanne Reno: [phone]  - Dr. Katrinka Blazing: [phone]   In the event of inclement weather, please call our main line at [phone] for an update on the status of any delays or closures.  Dermatology Medication Tips: Please keep the boxes that topical medications come in in order to help keep track of the instructions about where and how to use these. Pharmacies typically print  the medication instructions only on the boxes and not directly on the medication tubes.   If your medication is too expensive, please contact our office at [phone] option 4 or send [place] a message through MyChart.   We are unable to tell what your co-pay for medications will be in advance as this is different depending on your insurance coverage. However, we may be able to find a substitute medication at lower cost or fill out paperwork to get insurance to cover a needed medication.   If a prior authorization is required to get your medication covered by your insurance company, please allow [place] 1-2 business days to complete this process.  Drug prices often vary depending on where the prescription is filled and some pharmacies may offer cheaper prices.  The website www.goodrx.com contains coupons for medications through different pharmacies. The prices here do not account for what the cost may be with help from insurance (it may be cheaper with your insurance), but the website can give you the price if you did not use any insurance.  - You can print the associated coupon and take it with your prescription to the pharmacy.  - You may also stop by our office during regular business hours and pick up a GoodRx coupon card.  - If you need your prescription sent electronically to a different pharmacy, notify our office through [facility] or by phone at [phone] option 4.     Si Usted Necesita Algo Despus de Su Visita  Tambin puede enviarnos un mensaje a travs de Clinical cytogeneticist. Por lo general respondemos a los mensajes de [employer]  en el transcurso de 1 a 2 das hbiles.  Para renovar recetas, por favor pida a su farmacia que se ponga en contacto con nuestra oficina. Annie Sable de fax es [place] [phone].  Si tiene un asunto urgente cuando la clnica est cerrada y que no puede esperar hasta el siguiente da hbil, puede llamar/localizar a su doctor(a) al nmero que aparece a  continuacin.   Por favor, tenga en cuenta que aunque hacemos todo lo posible para estar disponibles para asuntos urgentes fuera del horario de [place], no estamos disponibles las 24 horas del da, los 7 [address]  [address] de la [place].   Si tiene un problema urgente y no puede comunicarse con nosotros, puede optar por buscar atencin mdica  en el consultorio de su doctor(a), en una clnica privada, en un centro de atencin urgente o en una sala de emergencias.  Si tiene Engineer, drilling, por favor llame inmediatamente al 911 o vaya a la sala de emergencias.  Nmeros de bper  - Dr. Gwen Pounds: [phone]  - Dra. Roseanne Reno: [phone]  - Dr. Katrinka Blazing: [phone]   En caso de inclemencias del tiempo, por favor llame a Lacy Duverney principal al [phone] para una actualizacin sobre el [place] de cualquier retraso o cierre.  Consejos para la medicacin en dermatologa: Por favor, guarde las cajas en las que vienen los medicamentos de uso tpico para ayudarle a seguir las instrucciones sobre dnde y cmo usarlos. Las farmacias generalmente imprimen las instrucciones del medicamento slo en las cajas y no directamente en los tubos del [place].   Si su medicamento es muy caro, por favor, pngase en contacto con Rolm Gala llamando al [phone] y presione la opcin 4 o envenos un mensaje a travs de Clinical cytogeneticist.   No podemos decirle cul ser su copago por los medicamentos por adelantado ya que esto es diferente dependiendo de la cobertura de su seguro. Sin embargo, es posible que podamos encontrar un medicamento sustituto a Audiological scientist un formulario para que el seguro cubra el medicamento que se considera necesario.   Si se requiere una autorizacin previa para que su compaa de seguros [place] su medicamento, por favor permtanos de 1 a 2 das hbiles para completar [address]  Los precios de los medicamentos varan con frecuencia dependiendo del Environmental consultant de dnde se surte la receta  y alguna farmacias pueden ofrecer precios ms baratos.  El sitio web www.goodrx.com tiene cupones para medicamentos de Health and safety inspector. Los precios aqu no tienen en cuenta lo que podra costar con la ayuda del seguro (puede ser ms barato con su seguro), pero el sitio web puede darle el precio si no utiliz Tourist information centre manager.  - Puede imprimir el cupn correspondiente y llevarlo con su receta a la farmacia.  - Tambin puede pasar por nuestra oficina durante el horario de atencin regular y Education officer, museum una tarjeta de cupones de GoodRx.  - Si necesita que su receta se enve electrnicamente a una farmacia diferente, informe a nuestra oficina a travs de MyChart de [place] o por telfono llamando al [phone] y presione la opcin 4.

## 2023-02-15 LAB — SURGICAL PATHOLOGY

## 2023-02-19 ENCOUNTER — Telehealth: Payer: Self-pay

## 2023-02-19 NOTE — Telephone Encounter (Signed)
 Advised pt of bx results/sh ?

## 2023-02-19 NOTE — Telephone Encounter (Signed)
-----   Message from Rexene Rattler sent at 02/19/2023 10:58 AM EST ----- 1. Skin, right thigh - anterior :       BASAL CELL CARCINOMA, SUPERFICIAL AND NODULAR PATTERNS   2. Skin, left lower back :       BASAL CELL CARCINOMA, SUPERFICIAL AND NODULAR PATTERNS   BCC skin cancers- both already treated with EDC at time of biopsy   - please call patient

## 2023-06-26 ENCOUNTER — Other Ambulatory Visit: Payer: Self-pay | Admitting: Physician Assistant

## 2023-06-26 ENCOUNTER — Ambulatory Visit
Admission: RE | Admit: 2023-06-26 | Discharge: 2023-06-26 | Disposition: A | Discharge status: Home / Self Care | Source: Ambulatory Visit | Attending: Physician Assistant | Admitting: Physician Assistant

## 2023-06-26 DIAGNOSIS — H8110 Benign paroxysmal vertigo, unspecified ear: Secondary | ICD-10-CM | POA: Diagnosis present

## 2023-06-26 DIAGNOSIS — R42 Dizziness and giddiness: Secondary | ICD-10-CM

## 2023-08-08 ENCOUNTER — Ambulatory Visit (INDEPENDENT_AMBULATORY_CARE_PROVIDER_SITE_OTHER): Admitting: Dermatology

## 2023-08-08 DIAGNOSIS — W908XXA Exposure to other nonionizing radiation, initial encounter: Secondary | ICD-10-CM

## 2023-08-08 DIAGNOSIS — L820 Inflamed seborrheic keratosis: Secondary | ICD-10-CM | POA: Diagnosis not present

## 2023-08-08 DIAGNOSIS — L821 Other seborrheic keratosis: Secondary | ICD-10-CM

## 2023-08-08 DIAGNOSIS — L578 Other skin changes due to chronic exposure to nonionizing radiation: Secondary | ICD-10-CM | POA: Diagnosis not present

## 2023-08-08 DIAGNOSIS — D492 Neoplasm of unspecified behavior of bone, soft tissue, and skin: Secondary | ICD-10-CM | POA: Diagnosis not present

## 2023-08-08 DIAGNOSIS — D485 Neoplasm of uncertain behavior of skin: Secondary | ICD-10-CM

## 2023-08-08 DIAGNOSIS — L570 Actinic keratosis: Secondary | ICD-10-CM
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
## 2023-08-08 NOTE — Patient Instructions (Addendum)

## 2023-08-13 ENCOUNTER — Encounter: Payer: Self-pay | Admitting: Dermatology

## 2023-08-13 ENCOUNTER — Ambulatory Visit: Payer: Medicare Other | Admitting: Dermatology

## 2023-08-13 ENCOUNTER — Ambulatory Visit: Admitting: Dermatology

## 2023-08-13 DIAGNOSIS — L820 Inflamed seborrheic keratosis: Secondary | ICD-10-CM | POA: Diagnosis not present

## 2023-08-13 DIAGNOSIS — D492 Neoplasm of unspecified behavior of bone, soft tissue, and skin: Secondary | ICD-10-CM

## 2023-08-13 DIAGNOSIS — C44622 Squamous cell carcinoma of skin of right upper limb, including shoulder: Secondary | ICD-10-CM

## 2023-08-13 NOTE — Progress Notes (Unsigned)
   Follow-Up Visit   Subjective  Caitlin Roy is a 83 y.o. female who presents for the following: pt here for bx on R dorsum hand- wasn't able to do it last visit because power went out in the office, check spot forehead that is sore   The following portions of the chart were reviewed this encounter and updated as appropriate: medications, allergies, medical history  Review of Systems:  No other skin or systemic complaints except as noted in HPI or Assessment and Plan.  Objective  Well appearing patient in no apparent distress; mood and affect are within normal limits.    A focused examination was performed of the following areas: Right hand  Relevant exam findings are noted in the Assessment and Plan.  Right Hand - Posterior 1.2 cm pink keratotic nodule   L medial eyebrow x 2 (2) Stuck on waxy paps with erythema  Assessment & Plan     NEOPLASM OF SKIN Right Hand - Posterior Epidermal / dermal shaving  Lesion diameter (cm):  1.2 Informed consent: discussed and consent obtained   Patient was prepped and draped in usual sterile fashion: area prepped with alcohol. Anesthesia: the lesion was anesthetized in a standard fashion   Anesthetic:  1% lidocaine w/ epinephrine 1-100,000 buffered w/ 8.4% NaHCO3 Instrument used: flexible razor blade   Hemostasis achieved with: pressure, aluminum chloride and electrodesiccation   Outcome: patient tolerated procedure well    Destruction of lesion  Destruction method: electrodesiccation and curettage   Informed consent: discussed and consent obtained   Curettage performed in three different directions: Yes   Electrodesiccation performed over the curetted area: Yes   Final wound size (cm):  1.3 Hemostasis achieved with:  pressure, aluminum chloride and electrodesiccation Outcome: patient tolerated procedure well with no complications   Post-procedure details: wound care instructions given   Additional details:  Mupirocin  ointment and Bandaid applied   Specimen 1 - Surgical pathology Differential Diagnosis: R/O SCC  Check Margins: yes 1.2 cm pink keratotic nodule EDC  INFLAMED SEBORRHEIC KERATOSIS (2) L medial eyebrow x 2 (2) Symptomatic, irritating, patient would like treated. Destruction of lesion - L medial eyebrow x 2 (2)  Destruction method: cryotherapy   Informed consent: discussed and consent obtained   Lesion destroyed using liquid nitrogen: Yes   Region frozen until ice ball extended beyond lesion: Yes   Outcome: patient tolerated procedure well with no complications   Post-procedure details: wound care instructions given   Additional details:  Prior to procedure, discussed risks of blister formation, small wound, skin dyspigmentation, or rare scar following cryotherapy. Recommend Vaseline ointment to treated areas while healing.    Return for as scheduled .  I, Grayce Saunas, RMA, am acting as scribe for Rexene Rattler, MD .   Documentation: I have reviewed the above documentation for accuracy and completeness, and I agree with the above.  Rexene Rattler, MD

## 2023-08-13 NOTE — Patient Instructions (Addendum)

## 2023-08-15 ENCOUNTER — Ambulatory Visit: Payer: Self-pay | Admitting: Dermatology

## 2023-08-15 LAB — SURGICAL PATHOLOGY

## 2023-08-16 ENCOUNTER — Telehealth: Payer: Self-pay

## 2023-08-16 NOTE — Telephone Encounter (Signed)
 Discussed biopsy results with patient

## 2023-09-17 ENCOUNTER — Other Ambulatory Visit: Payer: Self-pay | Admitting: Internal Medicine

## 2023-09-17 DIAGNOSIS — Z1231 Encounter for screening mammogram for malignant neoplasm of breast: Secondary | ICD-10-CM

## 2023-12-13 ENCOUNTER — Ambulatory Visit: Attending: Internal Medicine

## 2023-12-13 DIAGNOSIS — R42 Dizziness and giddiness: Secondary | ICD-10-CM | POA: Insufficient documentation

## 2023-12-13 DIAGNOSIS — R2681 Unsteadiness on feet: Secondary | ICD-10-CM | POA: Insufficient documentation

## 2023-12-13 NOTE — Therapy (Signed)
 OUTPATIENT PHYSICAL THERAPY EVALUATION Patient Name: Caitlin Roy MRN: [id number] DOB:[birth year], 83 y.o., female Today's Date: 12/13/2023  END OF SESSION:  PT End of Session - 12/13/23 1533     Visit Number 1    Number of Visits 16    Date for Recertification  02/07/24    Authorization Type Medicare    Authorization Time Period 12/13/23-02/07/24    Progress Note Due on Visit 10    PT Start Time 1530    PT Stop Time 1615    PT Time Calculation (min) 45 min    Equipment Utilized During Treatment Gait belt    Activity Tolerance Patient tolerated treatment well;No increased pain    Behavior During Therapy [facility] for tasks assessed/performed          Past Medical History:  Diagnosis Date   Actinic keratosis    Basal cell carcinoma 07/15/2019   Left nasal root. Superficial, ulcerated. MOHs    BCC (basal cell carcinoma) 01/18/2021   Right Medial Eyebrow, EDC 02/16/21   BCC (basal cell carcinoma) 02/13/2023   Left Lower Back, EDC   BCC (basal cell carcinoma) 02/13/2023   Right Thigh - Anterior, EDC   Hx of basal cell carcinoma 10/19/2014   Left upper scapula   Hx of basal cell carcinoma 10/19/2014   Right mid back at braline   Hx of basal cell carcinoma 07/24/2017   Right spinal lower back   Hx of basal cell carcinoma 03/05/2018   Left medial canthus   Hyperlipidemia    Squamous cell carcinoma of skin 04/25/2022   L ant thigh, EDC   Squamous cell carcinoma of skin 08/13/2023   right hand dorsum, treated with EDC   Past Surgical History:  Procedure Laterality Date   ABDOMINAL HYSTERECTOMY     BREAST BIOPSY Bilateral 10+ years ago   Negative   CORONARY ARTERY BYPASS GRAFT     There are no active problems to display for this patient.  PCP: Auston Reyes BIRCH, MD REFERRING PROVIDER: Auston Reyes BIRCH, MD REFERRING DIAG: Vertigo   THERAPY DIAG:  Dizziness and giddiness  Unsteadiness on feet  ONSET DATE: February 2025 with BPPV vertigo   Rationale for Evaluation and  Treatment: Rehabilitation  SUBJECTIVE:   SUBJECTIVE STATEMENT: Pt reports on going symptoms since having vertigo in Feb2025, no longer vertiginous, but equilibrium is off quite quickly when looking up or down. Also dizzy in morning and RT ear symptoms.   PERTINENT HISTORY:   Caitlin Roy is an 83yoF who reports illness in Feb 2025, s/p prednisone x2, then after sickness experienced insidious onset vertigo, soon made it to ENT, where they treated her 2, reported some BPPV, but indicated that she needed more vestibular rehab than they could provide. Pt has continued to have significant fluctuating disequilibrium since that time, albeit she denies any frank vertiginous symptoms, she does say there are frequent moments where she feels it coming on but it never does. Pt is limited as a historian, particularly in linear timeline of course of illness and also with relative frequency of symptoms, however she is quite colorful in her description of symptoms. She remarks on frequent fluctuations with Rt eye puffiness and Rt ear fullness feelings that relate to her symptoms. Pt says she has visual distortion on some days that precludes the ability to read text. Pt reports symptoms are almost always worse in the morning. Pt denies any falls in past 6 months, denies any close calls pertaining to this. 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   PAIN:  Are you having pain? No  PRECAUTIONS: None  WEIGHT BEARING RESTRICTIONS: No  FALLS: Has patient fallen in last 6 months? No  PATIENT GOALS: be able to fully move head for kitchen and garden tasks without disrupting her equilibrium.   OBJECTIVE:  Note: Objective measures were completed at Evaluation unless otherwise noted.  DIAGNOSTIC FINDINGS:  Pt reports a normal Head CT   PATIENT SURVEYS:  Will perform DHI on visit 2   VESTIBULAR  ASSESSMENT: Occulomotor screening:  pt has normal smooth pursuits moving Left to right, choppy Right to left, and very disrupted smooth pursuits moving up to/from down. -pt has nystagmus with sustained left gaze, left beating  Saccade testing: -consistent mild undershooting left and right  -vertical saccades are less organized difficult to characterize   Head shake test: -baseline visual acuity: Left 20/30 vision, Rt 20/75 (recommended pt follow up with eye doctor about this) (Need to repeat test for correct administration on visit 2)   Head Thrust Test: (+) right test, (-) left test   Test of skew: -mild lateral deviation WNL, negative test  Canal Testing: *used IR goggle video for testing  -head roll test, negative bilat -Dix hallpike test (+) Rt side only with Lt beating horizontal nystagmus.  -Pt symptomatic on Rt DixHallpike only.                                                                                                                            TREATMENT DATE 12/13/23 :  -education on findings, need to see optometry, additional testing needed next visit, and general prognosis  PATIENT EDUCATION: Education details: See above Person educated: Caitlin Roy Education method: collaborative learning, deliberate practice, positive reinforcement, explicit instruction, establish rules. Education comprehension: Fair  HOME EXERCISE PROGRAM:  GOALS: Goals reviewed with patient? No  SHORT TERM GOALS: Target date: 01/12/24  Negative canal testing to indicated resolved BPPV.  Baseline: (+) Rt dix hallpike  Goal status: new  2.  Pt to improve score on DHI by >10 Baseline: pending visit 2  Goal status: INITIAL  3.  Pt to reports consistent performance with VOR retraining exercises at home >4d/week.  Baseline: No performing Goal status: INITIAL  LONG TERM GOALS: Target date: 02/07/24 Pt to demonstrate ability to perform 5x60sec head nods with gaze stabilization on target at  2hz  or greater without loss of target focus and without increased symptoms.  Baseline:  Goal status: INITIAL  2.  Pt to reports >75% improve in ability to look up/down in kitchen without triggering disequilibrium.  Baseline: fearful of move head.  Goal status: INITIAL  3.  Pt to reports >75% improvement in symptoms associate with early morning bed routine and waking.  Baseline: bed in morning is worst symptom time.  Goal status: INITIAL  ASSESSMENT:  CLINICAL IMPRESSION: 83yoF  referred to OPPT vestibular therapy after several months of ongoing intermittent disequilibrium, pt very much inable to freely move her head for ADL, IADL, and leisure activity without triggering symptoms and feeling at risk for falling. Vestibular screening this date reveal of visual acuity asymmetry which warrants FU with optometry- also revealing of disruption of eye movements, intermittent nystagmus, and (+) Rt posterior canal test. Will complete additional tests and measures on visit 2. Pt will likely need treatment of BPPV, full assessment of any possible remaining hypofunction, visual accomodation training, and possible other intervention pending further testing. Patient will benefit from skilled physical therapy intervention to reduce deficits and impairments identified in evaluation, in order to reduce pain, improve quality of life, and maximize activity tolerance for ADL, IADL, and leisure/fitness. Physical therapy will help pt achieve long and short term goals of care.   OBJECTIVE IMPAIRMENTS: Decreased knowledge of condition, decreased use of DME, decreased mobility, difficulty walking, decreased strength, decreased ROM. ACTIVITY LIMITATIONS: Lifting, standing, walking, squatting, transfers, locomotion level PARTICIPATION LIMITATIONS: Cleaning, laundry, interpersonal relationships, driving, yardwork, community activity.  PERSONAL FACTORS: Age, behavior pattern, education, past/current experiences,  transportation, profession  are also affecting patient's functional outcome.  REHAB POTENTIAL: Good CLINICAL DECISION MAKING: Medium  EVALUATION COMPLEXITY: Moderate   PLAN:  PT FREQUENCY: 1-2x/week  PT DURATION: 8 weeks  PLANNED INTERVENTIONS: 97110-Therapeutic exercises, 97530- Therapeutic activity, [id number]- Neuromuscular re-education, 97535- Self Care, 02859- Manual therapy, [phone]- Gait training, [phone]- Canalith repositioning, Patient/Family education, Balance training, Stair training, Vestibular training, Visual/preceptual remediation/compensation, and Cryotherapy  PLAN FOR NEXT SESSION:  -treat Rt (+) posterior canal, repeat Head shake test with goggles; perform dynamic visual acuity test, DHI survey, test and perform gaze stabilization exercises with horizontal and vertical head movements. FU on recommendation to optometry.   5:15 PM, 12/13/23 Peggye JAYSON Linear, PT, DPT Physical Therapist - [facility]  Outpatient Physical Therapy- Main Campus [phone]     H. Rivera Colen C, PT 12/13/2023, 3:37 PM

## 2023-12-17 ENCOUNTER — Ambulatory Visit
Admission: RE | Admit: 2023-12-17 | Discharge: 2023-12-17 | Disposition: A | Discharge status: Home / Self Care | Source: Ambulatory Visit | Attending: Internal Medicine | Admitting: Internal Medicine

## 2023-12-17 DIAGNOSIS — Z1231 Encounter for screening mammogram for malignant neoplasm of breast: Secondary | ICD-10-CM | POA: Insufficient documentation

## 2023-12-18 ENCOUNTER — Ambulatory Visit

## 2023-12-18 DIAGNOSIS — R42 Dizziness and giddiness: Secondary | ICD-10-CM

## 2023-12-18 DIAGNOSIS — R2681 Unsteadiness on feet: Secondary | ICD-10-CM

## 2023-12-18 NOTE — Therapy (Signed)
 OUTPATIENT PHYSICAL THERAPY TREATMENT Patient Name: Caitlin Roy MRN: [id number] DOB:1941-01-24, 83 y.o., female Today's Date: 12/18/2023  END OF SESSION:  PT End of Session - 12/18/23 1405     Visit Number 2    Number of Visits 16    Date for Recertification  02/07/24    Authorization Type Medicare    Authorization Time Period 12/13/23-02/07/24    Progress Note Due on Visit 10    PT Start Time 1401    PT Stop Time 1441    PT Time Calculation (min) 40 min    Activity Tolerance Patient tolerated treatment well;No increased pain    Behavior During Therapy [facility] for tasks assessed/performed          Past Medical History:  Diagnosis Date   Actinic keratosis    Basal cell carcinoma 07/15/2019   Left nasal root. Superficial, ulcerated. MOHs    BCC (basal cell carcinoma) 01/18/2021   Right Medial Eyebrow, EDC 02/16/21   BCC (basal cell carcinoma) 02/13/2023   Left Lower Back, EDC   BCC (basal cell carcinoma) 02/13/2023   Right Thigh - Anterior, EDC   Hx of basal cell carcinoma 10/19/2014   Left upper scapula   Hx of basal cell carcinoma 10/19/2014   Right mid back at braline   Hx of basal cell carcinoma 07/24/2017   Right spinal lower back   Hx of basal cell carcinoma 03/05/2018   Left medial canthus   Hyperlipidemia    Squamous cell carcinoma of skin 04/25/2022   L ant thigh, EDC   Squamous cell carcinoma of skin 08/13/2023   right hand dorsum, treated with EDC   Past Surgical History:  Procedure Laterality Date   ABDOMINAL HYSTERECTOMY     BREAST BIOPSY Bilateral 10+ years ago   Negative   CORONARY ARTERY BYPASS GRAFT     There are no active problems to display for this patient.  PCP: Caitlin Reyes BIRCH, MD REFERRING PROVIDER: Auston Reyes BIRCH, MD REFERRING DIAG: Vertigo   THERAPY DIAG:  Dizziness and giddiness  Unsteadiness on feet  ONSET DATE: February 2025 with BPPV vertigo   Rationale for Evaluation and Treatment: Rehabilitation  SUBJECTIVE:    SUBJECTIVE STATEMENT: Pt says head felt dizzy the day follow evaluation here, but then was dizzy free for several days up until today. Slightly dizzy. Pt has been trying to find optometrist that can see her in a reasonable time frame, now scheduled for Dec 8.    PERTINENT HISTORY:   Caitlin Roy is an 83yoF who reports illness in Feb 2025, s/p prednisone x2, then after sickness experienced insidious onset vertigo, soon made it to ENT, where they treated her 2, reported some BPPV, but indicated that she needed more vestibular rehab than they could provide. Pt has continued to have significant fluctuating disequilibrium since that time, albeit she denies any frank vertiginous symptoms, she does say there are frequent moments where she feels it coming on but it never does. Pt is limited as a historian, particularly in linear timeline of course of illness and also with relative frequency of symptoms, however she is quite colorful in her description of symptoms. She remarks on frequent fluctuations with Rt eye puffiness and Rt ear fullness feelings that relate to her symptoms. Pt says she has visual distortion on some days that precludes the ability to read text. Pt reports symptoms are almost always worse in the morning. Pt denies any falls in past 6 months, denies any close calls pertaining  to this. 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   PAIN:  Are you having pain? No  PRECAUTIONS: None  WEIGHT BEARING RESTRICTIONS: No  FALLS: Has patient fallen in last 6 months? No  PATIENT GOALS: be able to fully move head for kitchen and garden tasks without disrupting her equilibrium.   OBJECTIVE:  Note: Objective measures were completed at Evaluation unless otherwise noted.  DIAGNOSTIC FINDINGS:  Pt reports a normal Head CT   PATIENT SURVEYS:  DHI: 12/18/23:    VESTIBULAR ASSESSMENT  (from visit 1): Occulomotor screening:  pt has normal smooth pursuits moving Left to right, choppy Right to left, and very disrupted smooth pursuits moving up to/from down. -pt has nystagmus with sustained left gaze, left beating.  Saccade testing: -consistent mild undershooting left and right  -vertical saccades are less organized difficult to characterize    -baseline visual acuity: Left 20/30 vision, Rt 20/75 (recommended pt follow up with eye doctor about this) (Need to repeat test for correct administration on visit 2)   Head shake test: -negative (can read same line of text)   Head Thrust Test: (+) right test, (-) left test   Test of skew: -mild lateral deviation WNL, negative test  Canal Testing: *used IR goggle video for testing  -head roll test, negative bilat -Dix hallpike test (+) Rt side only with Lt beating horizontal nystagmus.  -Pt symptomatic on Rt DixHallpike only.                                                                                                                            TREATMENT DATE 12/18/23 :  -FU discussion about optometry FU: pt will see optometry on Dec 8th  -DHI survey: 46% -dynamic visual acuity test: Negative  -Head shaking nystagmus: (+) Left lateral beating nystagmus -gaze stabilization with vertical head nods 3x30sec  -gaze stabilization with horizontal head turns 3x30sec  -issued paper handout for HEP performance of the above   PATIENT EDUCATION: Education details: See above Person educated: Caitlin Roy Education method: collaborative learning, deliberate practice, positive reinforcement, explicit instruction, establish rules. Education comprehension: Fair  HOME EXERCISE PROGRAM: Access Code: [facility] URL: [email or web address] Date: 12/18/2023 Prepared by: Peggye Linear  Exercises - Seated Gaze Stabilization with Head Nod  - 2 x daily - 7 x weekly - 3 sets - 30sec hold - Seated Gaze Stabilization with Head  Nod  - 2 x daily - 7 x weekly - 3 sets - 30sec hold  GOALS: Goals reviewed with patient? No  SHORT TERM GOALS: Target date: 01/12/24  Negative canal testing to indicated resolved BPPV.  Baseline: (+) Rt dix hallpike  Goal status: new  2.  Pt to improve score on DHI by >10 Baseline: pending visit 2  Goal status: INITIAL  3.  Pt to reports consistent performance with VOR retraining exercises at home >4d/week.  Baseline: No performing Goal status: INITIAL  LONG TERM GOALS: Target date: 02/07/24 Pt to demonstrate ability to perform 5x60sec head nods with gaze stabilization on target at 2hz  or greater without loss of target focus and without increased symptoms.  Baseline:  Goal status: INITIAL  2.  Pt to reports >75% improve in ability to look up/down in kitchen without triggering disequilibrium.  Baseline: fearful of move head.  Goal status: INITIAL  3.  Pt to reports >75% improvement in symptoms associate with early morning bed routine and waking.  Baseline: bed in morning is worst symptom time.  Goal status: INITIAL  ASSESSMENT:  CLINICAL IMPRESSION: Completed remaining vestibular tests today and began VOR retraining exercises, generally well tolerated, with some cervical motor ataxia from fatigue toward each bout. Pt (+) head shaking nystagmus with a few small beats horizontally to left post testing. Dynamic visual acuity testnegative, indicates preserved and functional VOR. Patient will benefit from skilled physical therapy intervention to reduce deficits and impairments identified in evaluation, in order to reduce pain, improve quality of life, and maximize activity tolerance for ADL, IADL, and leisure/fitness. Physical therapy will help pt achieve long and short term goals of care.   OBJECTIVE IMPAIRMENTS: Decreased knowledge of condition, decreased use of DME, decreased mobility, difficulty walking, decreased strength, decreased ROM. ACTIVITY LIMITATIONS: Lifting, standing,  walking, squatting, transfers, locomotion level PARTICIPATION LIMITATIONS: Cleaning, laundry, interpersonal relationships, driving, yardwork, community activity.  PERSONAL FACTORS: Age, behavior pattern, education, past/current experiences, transportation, profession  are also affecting patient's functional outcome.  REHAB POTENTIAL: Good CLINICAL DECISION MAKING: Medium  EVALUATION COMPLEXITY: Moderate   PLAN:  PT FREQUENCY: 1-2x/week  PT DURATION: 8 weeks  PLANNED INTERVENTIONS: 97110-Therapeutic exercises, 97530- Therapeutic activity, [id number]- Neuromuscular re-education, 97535- Self Care, 02859- Manual therapy, [phone]- Gait training, [phone]- Canalith repositioning, Patient/Family education, Balance training, Stair training, Vestibular training, Visual/preceptual remediation/compensation, and Cryotherapy  PLAN FOR NEXT SESSION:  -treat Rt (+) posterior canal, repeat Head shake test with goggles; perform dynamic visual acuity test, DHI survey, test and perform gaze stabilization exercises with horizontal and vertical head movements. FU on recommendation to optometry.   2:06 PM, 12/18/23 Peggye JAYSON Linear, PT, DPT Physical Therapist - [facility]  Outpatient Physical Therapy- Main Campus [phone]     [place] C, PT 12/18/2023, 2:06 PM

## 2023-12-19 ENCOUNTER — Other Ambulatory Visit: Payer: Self-pay | Admitting: Internal Medicine

## 2023-12-19 DIAGNOSIS — R928 Other abnormal and inconclusive findings on diagnostic imaging of breast: Secondary | ICD-10-CM

## 2023-12-20 ENCOUNTER — Ambulatory Visit
Admission: RE | Admit: 2023-12-20 | Discharge: 2023-12-20 | Disposition: A | Discharge status: Home / Self Care | Source: Ambulatory Visit | Attending: Internal Medicine | Admitting: Internal Medicine

## 2023-12-20 DIAGNOSIS — R928 Other abnormal and inconclusive findings on diagnostic imaging of breast: Secondary | ICD-10-CM

## 2023-12-21 ENCOUNTER — Other Ambulatory Visit: Payer: Self-pay | Admitting: Internal Medicine

## 2023-12-21 DIAGNOSIS — R928 Other abnormal and inconclusive findings on diagnostic imaging of breast: Secondary | ICD-10-CM

## 2023-12-25 ENCOUNTER — Ambulatory Visit
Admission: RE | Admit: 2023-12-25 | Discharge: 2023-12-25 | Disposition: A | Discharge status: Home / Self Care | Source: Ambulatory Visit | Attending: Internal Medicine | Admitting: Internal Medicine

## 2023-12-25 DIAGNOSIS — C50812 Malignant neoplasm of overlapping sites of left female breast: Secondary | ICD-10-CM | POA: Diagnosis present

## 2023-12-25 DIAGNOSIS — R928 Other abnormal and inconclusive findings on diagnostic imaging of breast: Secondary | ICD-10-CM | POA: Insufficient documentation

## 2023-12-25 DIAGNOSIS — C50912 Malignant neoplasm of unspecified site of left female breast: Secondary | ICD-10-CM

## 2023-12-25 HISTORY — PX: BREAST BIOPSY: SHX20

## 2023-12-25 HISTORY — DX: Malignant neoplasm of unspecified site of left female breast: C50.912

## 2023-12-25 MED ORDER — LIDOCAINE 1 % OPTIME INJ - NO CHARGE
2.0000 mL | Freq: Once | INTRAMUSCULAR | Status: AC
  Administered 2023-12-25: 2 mL via INTRADERMAL
  Filled 2023-12-25: qty 2

## 2023-12-25 MED ORDER — LIDOCAINE-EPINEPHRINE 1 %-1:100000 IJ SOLN
10.0000 mL | Freq: Once | INTRAMUSCULAR | Status: AC
  Administered 2023-12-25: 10 mL via INTRADERMAL
  Filled 2023-12-25: qty 10

## 2023-12-26 ENCOUNTER — Ambulatory Visit

## 2023-12-26 DIAGNOSIS — R42 Dizziness and giddiness: Secondary | ICD-10-CM

## 2023-12-26 DIAGNOSIS — R2681 Unsteadiness on feet: Secondary | ICD-10-CM

## 2023-12-26 LAB — SURGICAL PATHOLOGY

## 2023-12-26 NOTE — Therapy (Signed)
 OUTPATIENT PHYSICAL THERAPY TREATMENT Patient Name: Caitlin Roy MRN: [id number] DOB:1940-02-28, 83 y.o., female Today's Date: 12/26/2023  END OF SESSION:  PT End of Session - 12/26/23 1108     Visit Number 3    Number of Visits 16    Date for Recertification  02/07/24    Authorization Type Medicare    Authorization Time Period 12/13/23-02/07/24    Progress Note Due on Visit 10    PT Start Time 1103    PT Stop Time 1143    PT Time Calculation (min) 40 min    Equipment Utilized During Treatment Gait belt    Activity Tolerance Patient tolerated treatment well;No increased pain    Behavior During Therapy [facility] for tasks assessed/performed          Past Medical History:  Diagnosis Date   Actinic keratosis    Basal cell carcinoma 07/15/2019   Left nasal root. Superficial, ulcerated. MOHs    BCC (basal cell carcinoma) 01/18/2021   Right Medial Eyebrow, EDC 02/16/21   BCC (basal cell carcinoma) 02/13/2023   Left Lower Back, EDC   BCC (basal cell carcinoma) 02/13/2023   Right Thigh - Anterior, EDC   Hx of basal cell carcinoma 10/19/2014   Left upper scapula   Hx of basal cell carcinoma 10/19/2014   Right mid back at braline   Hx of basal cell carcinoma 07/24/2017   Right spinal lower back   Hx of basal cell carcinoma 03/05/2018   Left medial canthus   Hyperlipidemia    Squamous cell carcinoma of skin 04/25/2022   L ant thigh, EDC   Squamous cell carcinoma of skin 08/13/2023   right hand dorsum, treated with [facility]   Past Surgical History:  Procedure Laterality Date   ABDOMINAL HYSTERECTOMY     BREAST BIOPSY Bilateral 10+ years ago   Negative   BREAST BIOPSY Left 12/25/2023   US  LT BREAST BX W LOC DEV 1ST LESION IMG BX SPEC US  GUIDE 12/25/2023 [facility]   CORONARY ARTERY BYPASS GRAFT     There are no active problems to display for this patient.  PCP: Auston Reyes BIRCH, MD REFERRING PROVIDER: Auston Reyes BIRCH, MD REFERRING DIAG: Vertigo   THERAPY DIAG:   Dizziness and giddiness  Unsteadiness on feet  ONSET DATE: February 2025 with BPPV vertigo   Rationale for Evaluation and Treatment: Rehabilitation  SUBJECTIVE:   SUBJECTIVE STATEMENT: Dizziness has been generally good since.  VOR exercises going well, thinks her vision is improving.   PERTINENT HISTORY:   Caitlin Roy is an 83yoF who reports illness in Feb 2025, s/p prednisone x2, then after sickness experienced insidious onset vertigo, soon made it to ENT, where they treated her 2, reported some BPPV, but indicated that she needed more vestibular rehab than they could provide. Pt has continued to have significant fluctuating disequilibrium since that time, albeit she denies any frank vertiginous symptoms, she does say there are frequent moments where she feels it coming on but it never does. Pt is limited as a historian, particularly in linear timeline of course of illness and also with relative frequency of symptoms, however she is quite colorful in her description of symptoms. She remarks on frequent fluctuations with Rt eye puffiness and Rt ear fullness feelings that relate to her symptoms. Pt says she has visual distortion on some days that precludes the ability to read text. Pt reports symptoms are almost always worse in the morning. Pt denies any falls in past 6 months, denies  any close calls pertaining to this. 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   PAIN:  Are you having pain? No  PRECAUTIONS: None  WEIGHT BEARING RESTRICTIONS: No  FALLS: Has patient fallen in last 6 months? No  PATIENT GOALS: be able to fully move head for kitchen and garden tasks without disrupting her equilibrium.   OBJECTIVE:  Note: Objective measures were completed at Evaluation unless otherwise noted.  DIAGNOSTIC FINDINGS:  Pt reports a normal Head CT   PATIENT SURVEYS:  DHI: 12/18/23:     VESTIBULAR ASSESSMENT (from visit 1): Occulomotor screening:  pt has normal smooth pursuits moving Left to right, choppy Right to left, and very disrupted smooth pursuits moving up to/from down. -pt has nystagmus with sustained left gaze, left beating.  Saccade testing: -consistent mild undershooting left and right  -vertical saccades are less organized difficult to characterize    -baseline visual acuity: Left 20/30 vision, Rt 20/75 (recommended pt follow up with eye doctor about this) (Need to repeat test for correct administration on visit 2)   Head shake test: -negative (can read same line of text)   Head Thrust Test: (+) right test, (-) left test   Test of skew: -mild lateral deviation WNL, negative test  Canal Testing: *used IR goggle video for testing  -head roll test, negative bilat -Dix hallpike test (+) Rt side only with Lt beating horizontal nystagmus.  -Pt symptomatic on Rt DixHallpike only.                                                                                                                            TREATMENT DATE 12/26/23 :  -standing gaze stabilization c hor head turns 3x45sec (pursuits looks generally good, some neck fatigue toward end of each) --standing gaze stabilization c vert head turns 3x45sec (no symptoms)  -seated forward bend to draw a card, sort by suit on the left side 20cards left, 20 card Right -airex stance horizontal head turns calling out cards x10 each  -airex stance with overhead card glance and call out x15 -walking with ball toss/catch x150ft, minGuard A (no LOB)     PATIENT EDUCATION: Education details: See above Person educated: [name] Education method: collaborative learning, deliberate practice, positive reinforcement, explicit instruction, establish rules. Education comprehension: Fair  HOME EXERCISE PROGRAM: Access Code: [facility] URL: [email or web address] Date: 12/18/2023 Prepared by: Peggye Linear  Exercises - Seated Gaze Stabilization with Head Nod  - 2 x daily - 7 x weekly - 3 sets - 30sec hold - Seated Gaze Stabilization with Head Nod  - 2 x daily - 7 x weekly - 3 sets - 30sec hold  GOALS: Goals reviewed with patient? No  SHORT TERM GOALS: Target date: 01/12/24  Negative canal testing to indicated resolved BPPV.  Baseline: (+) Rt dix hallpike  Goal status: new  2.  Pt  to improve score on DHI by >10 Baseline: pending visit 2  Goal status: INITIAL  3.  Pt to reports consistent performance with VOR retraining exercises at home >4d/week.  Baseline: No performing Goal status: INITIAL  LONG TERM GOALS: Target date: 02/07/24 Pt to demonstrate ability to perform 5x60sec head nods with gaze stabilization on target at 2hz  or greater without loss of target focus and without increased symptoms.  Baseline:  Goal status: INITIAL  2.  Pt to reports >75% improve in ability to look up/down in kitchen without triggering disequilibrium.  Baseline: fearful of move head.  Goal status: INITIAL  3.  Pt to reports >75% improvement in symptoms associate with early morning bed routine and waking.  Baseline: bed in morning is worst symptom time.  Goal status: INITIAL  ASSESSMENT:  CLINICAL IMPRESSION: Continued with VOR training and added in accomodation training today. Balance remains intact. Pt able to complete entire session without any increased dizziness. Patient will benefit from skilled physical therapy intervention to reduce deficits and impairments identified in evaluation, in order to reduce pain, improve quality of life, and maximize activity tolerance for ADL, IADL, and leisure/fitness. Physical therapy will help pt achieve long and short term goals of care.   OBJECTIVE IMPAIRMENTS: Decreased knowledge of condition, decreased use of DME, decreased mobility, difficulty walking, decreased strength, decreased ROM. ACTIVITY LIMITATIONS: Lifting, standing, walking, squatting,  transfers, locomotion level PARTICIPATION LIMITATIONS: Cleaning, laundry, interpersonal relationships, driving, yardwork, community activity.  PERSONAL FACTORS: Age, behavior pattern, education, past/current experiences, transportation, profession  are also affecting patient's functional outcome.  REHAB POTENTIAL: Good CLINICAL DECISION MAKING: Medium  EVALUATION COMPLEXITY: Moderate   PLAN:  PT FREQUENCY: 1-2x/week  PT DURATION: 8 weeks  PLANNED INTERVENTIONS: 97110-Therapeutic exercises, 97530- Therapeutic activity, [id number]- Neuromuscular re-education, 97535- Self Care, 02859- Manual therapy, [phone]- Gait training, [phone]- Canalith repositioning, Patient/Family education, Balance training, Stair training, Vestibular training, Visual/preceptual remediation/compensation, and Cryotherapy  PLAN FOR NEXT SESSION:  -treat Rt (+) posterior canal, repeat Head shake test with goggles; perform dynamic visual acuity test, DHI survey, test and perform gaze stabilization exercises with horizontal and vertical head movements. FU on recommendation to optometry.   11:09 AM, 12/26/23 Peggye JAYSON Linear, PT, DPT Physical Therapist - [facility]  Outpatient Physical Therapy- Main Campus [phone]     Yolander Goodie C, PT 12/26/2023, 11:09 AM

## 2023-12-27 ENCOUNTER — Encounter: Payer: Self-pay | Admitting: *Deleted

## 2023-12-27 DIAGNOSIS — C50919 Malignant neoplasm of unspecified site of unspecified female breast: Secondary | ICD-10-CM

## 2023-12-27 NOTE — Progress Notes (Signed)
 Received referral for newly diagnosed breast cancer from [facility].  Navigation initiated.  Ms. Killmer will see Dr. Cesar on 11/25 and Dr. Jacobo on 11/26.

## 2023-12-31 ENCOUNTER — Ambulatory Visit

## 2023-12-31 DIAGNOSIS — R2681 Unsteadiness on feet: Secondary | ICD-10-CM

## 2023-12-31 DIAGNOSIS — R42 Dizziness and giddiness: Secondary | ICD-10-CM | POA: Diagnosis not present

## 2023-12-31 NOTE — Therapy (Signed)
 OUTPATIENT PHYSICAL THERAPY TREATMENT Patient Name: Caitlin Roy MRN: [id number] DOB:21-Dec-1940, 83 y.o., female Today's Date: 12/31/2023  END OF SESSION:  PT End of Session - 12/31/23 1403     Visit Number 4    Number of Visits 16    Date for Recertification  02/07/24    Authorization Type Medicare    Authorization Time Period 12/13/23-02/07/24    Progress Note Due on Visit 10    PT Start Time 1400    PT Stop Time 1440    PT Time Calculation (min) 40 min    Equipment Utilized During Treatment Gait belt    Activity Tolerance Patient tolerated treatment well;No increased pain    Behavior During Therapy [facility] for tasks assessed/performed          Past Medical History:  Diagnosis Date   Actinic keratosis    Basal cell carcinoma 07/15/2019   Left nasal root. Superficial, ulcerated. MOHs    BCC (basal cell carcinoma) 01/18/2021   Right Medial Eyebrow, EDC 02/16/21   BCC (basal cell carcinoma) 02/13/2023   Left Lower Back, EDC   BCC (basal cell carcinoma) 02/13/2023   Right Thigh - Anterior, EDC   Hx of basal cell carcinoma 10/19/2014   Left upper scapula   Hx of basal cell carcinoma 10/19/2014   Right mid back at braline   Hx of basal cell carcinoma 07/24/2017   Right spinal lower back   Hx of basal cell carcinoma 03/05/2018   Left medial canthus   Hyperlipidemia    Squamous cell carcinoma of skin 04/25/2022   L ant thigh, EDC   Squamous cell carcinoma of skin 08/13/2023   right hand dorsum, treated with [facility]   Past Surgical History:  Procedure Laterality Date   ABDOMINAL HYSTERECTOMY     BREAST BIOPSY Bilateral 10+ years ago   Negative   BREAST BIOPSY Left 12/25/2023   US  LT BREAST BX W LOC DEV 1ST LESION IMG BX SPEC US  GUIDE 12/25/2023 [facility]   CORONARY ARTERY BYPASS GRAFT     There are no active problems to display for this patient.  PCP: Auston Reyes BIRCH, MD REFERRING PROVIDER: Auston Reyes BIRCH, MD REFERRING DIAG: Vertigo   THERAPY DIAG:   Dizziness and giddiness  Unsteadiness on feet  ONSET DATE: February 2025 with BPPV vertigo   Rationale for Evaluation and Treatment: Rehabilitation  SUBJECTIVE:   SUBJECTIVE STATEMENT: Dizziness has been generally good since last time. HEP going fine  Pt was dizzy after last session for a bit, but then things resolved.   PERTINENT HISTORY:   Caitlin Roy is an 83yoF who reports illness in Feb 2025, s/p prednisone x2, then after sickness experienced insidious onset vertigo, soon made it to ENT, where they treated her 2, reported some BPPV, but indicated that she needed more vestibular rehab than they could provide. Pt has continued to have significant fluctuating disequilibrium since that time, albeit she denies any frank vertiginous symptoms, she does say there are frequent moments where she feels it coming on but it never does. Pt is limited as a historian, particularly in linear timeline of course of illness and also with relative frequency of symptoms, however she is quite colorful in her description of symptoms. She remarks on frequent fluctuations with Rt eye puffiness and Rt ear fullness feelings that relate to her symptoms. Pt says she has visual distortion on some days that precludes the ability to read text. Pt reports symptoms are almost always worse in the morning.  Pt denies any falls in past 6 months, denies any close calls pertaining to this. 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   PAIN:  Are you having pain? No  PRECAUTIONS: None  WEIGHT BEARING RESTRICTIONS: No  FALLS: Has patient fallen in last 6 months? No  PATIENT GOALS: be able to fully move head for kitchen and garden tasks without disrupting her equilibrium.   OBJECTIVE:  Note: Objective measures were completed at Evaluation unless otherwise noted.  DIAGNOSTIC FINDINGS:  Pt reports a normal Head CT    PATIENT SURVEYS:  DHI: 12/18/23:    VESTIBULAR ASSESSMENT (from visit 1): Occulomotor screening:  pt has normal smooth pursuits moving Left to right, choppy Right to left, and very disrupted smooth pursuits moving up to/from down. -pt has nystagmus with sustained left gaze, left beating.  Saccade testing: -consistent mild undershooting left and right  -vertical saccades are less organized difficult to characterize   -baseline visual acuity: Left 20/30 vision, Rt 20/75 (recommended pt follow up with eye doctor about this) (Need to repeat test for correct administration on visit 2)   Head shake test: -negative (can read same line of text)   Head Thrust Test: (+) right test, (-) left test   Test of skew: -mild lateral deviation WNL, negative test  Canal Testing: *used IR goggle video for testing  -head roll test, negative bilat -Dix hallpike test (+) Rt side only with Lt beating horizontal nystagmus.  -Pt symptomatic on Rt DixHallpike only.                                                                                                                            TREATMENT DATE 12/31/23 :   -standing gaze stabilization c hor head turns 1x45sec  -standing gaze stabilization c vert head turns 1x45sec  -standing gaze stabilization c hor head turns 1x45sec  -standing gaze stabilization c vert head turns 1x45sec  -seated pencil pushups with 2 Diamonds 15x3secH (<8)   -standing, bend and draw a card then place on overhead cabinet shelf: 10 from waist level, 10 from knee level, standing on airex pad.   -seated pencil pushups with 2 Diamonds 15x3secH  -VOR cancelation with horizontal head mvt x45sec (laser headlamp)  -VOR cancelation with vertical head mvt x45sec (laser headlamp)  -VOR cancelation with horizontal head mvt x45sec (laser headlamp)  -VOR cancelation with vertical head mvt x45sec (laser headlamp)  -VOR cancelation with horizontal head mvt x45sec (laser headlamp)  with busy background (hospital gown)  -VOR cancelation with vertical head mvt x45sec (laser headlamp) with busy background (hospital gown)  *no provoking symptoms with these  -walking forward and backward with ball toss/catch 2x37ft, minGuard A (no LOB)  -walking forward 2x27ft with eyes closed x30 sec *very mild symptoms provocation since starting session  PATIENT EDUCATION: Education details: See above Person educated: [employer]  method: collaborative learning, deliberate practice, positive reinforcement, explicit instruction, establish rules. Education comprehension: Fair  HOME EXERCISE PROGRAM: Access Code: [facility] URL: [email or web address] Date: 12/18/2023 Prepared by: Peggye Linear  Exercises - Seated Gaze Stabilization with Head Nod  - 2 x daily - 7 x weekly - 3 sets - 30sec hold - Seated Gaze Stabilization with Head Nod  - 2 x daily - 7 x weekly - 3 sets - 30sec hold  GOALS: Goals reviewed with patient? No  SHORT TERM GOALS: Target date: 01/12/24  Negative canal testing to indicated resolved BPPV.  Baseline: (+) Rt dix hallpike  Goal status: new  2.  Pt to improve score on DHI by >10 Baseline: pending visit 2  Goal status: INITIAL  3.  Pt to reports consistent performance with VOR retraining exercises at home >4d/week.  Baseline: No performing Goal status: INITIAL  LONG TERM GOALS: Target date: 02/07/24 Pt to demonstrate ability to perform 5x60sec head nods with gaze stabilization on target at 2hz  or greater without loss of target focus and without increased symptoms.  Baseline:  Goal status: INITIAL  2.  Pt to reports >75% improve in ability to look up/down in kitchen without triggering disequilibrium.  Baseline: fearful of move head.  Goal status: INITIAL  3.  Pt to reports >75% improvement in symptoms associate with early morning bed routine and waking.  Baseline: bed in morning is worst symptom time.  Goal status:  INITIAL  ASSESSMENT:  CLINICAL IMPRESSION: Continued with VOR training and added in accomodation training today. Balance remains intact. Pt does well with sagittal plane activities, a prime limitaiton/trigger with household episodes. Introduced VOR cancellation with head laser today, more challenging for patient. Pt able to complete entire session without any increased dizziness. It does appear pt is making gradual progress overall. Patient will benefit from skilled physical therapy intervention to reduce deficits and impairments identified in evaluation, in order to reduce pain, improve quality of life, and maximize activity tolerance for ADL, IADL, and leisure/fitness. Physical therapy will help pt achieve long and short term goals of care.   OBJECTIVE IMPAIRMENTS: Decreased knowledge of condition, decreased use of DME, decreased mobility, difficulty walking, decreased strength, decreased ROM. ACTIVITY LIMITATIONS: Lifting, standing, walking, squatting, transfers, locomotion level PARTICIPATION LIMITATIONS: Cleaning, laundry, interpersonal relationships, driving, yardwork, community activity.  PERSONAL FACTORS: Age, behavior pattern, education, past/current experiences, transportation, profession  are also affecting patient's functional outcome.  REHAB POTENTIAL: Good CLINICAL DECISION MAKING: Medium  EVALUATION COMPLEXITY: Moderate   PLAN:  PT FREQUENCY: 1-2x/week  PT DURATION: 8 weeks  PLANNED INTERVENTIONS: 97110-Therapeutic exercises, 97530- Therapeutic activity, [id number]- Neuromuscular re-education, 97535- Self Care, 02859- Manual therapy, [phone]- Gait training, [phone]- Canalith repositioning, Patient/Family education, Balance training, Stair training, Vestibular training, Visual/preceptual remediation/compensation, and Cryotherapy  PLAN FOR NEXT SESSION:  -treat Rt (+) posterior canal, repeat Head shake test with goggles; perform dynamic visual acuity test, DHI survey, test and perform  gaze stabilization exercises with horizontal and vertical head movements. FU on recommendation to optometry.   2:05 PM, 12/31/23 Peggye JAYSON Linear, PT, DPT Physical Therapist - [facility]  Outpatient Physical Therapy- Main Campus [phone]     Rendon C, PT 12/31/2023, 2:05 PM

## 2024-01-01 NOTE — H&P (View-Only) (Signed)
 History of Present Illness Caitlin Roy is an 83 year old female with a new diagnosis of left breast cancer who presents for evaluation. She is accompanied by her son.  She underwent a routine screening mammogram on December 17, 2023, which suggested a possible mass in the left breast. A subsequent diagnostic mammogram and ultrasound confirmed a suspicious 8 mm mass at the 12 o'clock position, 9 cm from the nipple. The mass was described as irregular and hypoechoic.  I personally evaluated the images.  A core biopsy was performed, revealing invasive ductal carcinoma, grade two.  The mammogram was part of her yearly routine, and she was not experiencing any symptoms such as palpable lumps or skin changes prior to the screening.  Her past medical history is significant for multiple skin cancer lesions. She denies any other personal history of cancer.  Family history is notable for an aunt with breast cancer and a mother with kidney cancer.      PAST MEDICAL HISTORY:  Past Medical History:  Diagnosis Date  . Anxiety   . Arthritis    not formally diagnosed but has it in my fingers  . Basal cell carcinoma   . Cervical radiculopathy    with neck pain  . Coronary artery disease   . Encounter for blood transfusion    during CABG  2 units  . GERD (gastroesophageal reflux disease)   . Hiatal hernia    on protonix  . History of fibrocystic disease of breast   . Hx of degenerative disc disease   . Hyperlipidemia   . Hypertension   . Pneumonia    during childhood  . Posterior capsular opacification         PAST SURGICAL HISTORY:   Past Surgical History:  Procedure Laterality Date  . EXTRACTION CATARACT EXTRACAPSULAR W/INSERTION INTRAOCULAR PROSTHESIS Right 11/17/2013   Procedure: R--LENSX/LRI+NANO ($600+N/C)--EXTRACTION CATARACT EXTRACAPSULAR W/INSERTION INTRAOCULAR PROSTHESIS;  Surgeon: Dale Aureliano Rubenstein, MD;  Location: [facility] OR;  Service: Ophthalmology;  Laterality: Right;  .  EXTRACTION CATARACT EXTRACAPSULAR W/INSERTION INTRAOCULAR PROSTHESIS Left 12/01/2013   Procedure: L--LENSX/LRI+NANO ($600+N/C)--EXTRACTION CATARACT EXTRACAPSULAR W/INSERTION INTRAOCULAR PROSTHESIS;  Surgeon: Dale Aureliano Rubenstein, MD;  Location: [facility] OR;  Service: Ophthalmology;  Laterality: Left;  . YAG Laser capsulotomy of left eye Left 2017  . FULL THICKNESS SKIN GRAFT EAR Left 10/10/2019   Procedure: FULL THICKNESS SKIN GRAFT;  Surgeon: Hillard Pipe, MD;  Location[facility];  Service: Ophthalmology;  Laterality: Left;  . TRANSFER ADJACENT TISSUE EAR Left 10/10/2019   Procedure: TRANSFER ADJACENT TISSUE;  Surgeon: Hillard Pipe, MD;  [facility];  Service: Ophthalmology;  Laterality: Left;  . WOUND PREP / INCISIONAL RELEASE SCAR ANKLE/FOOR/TOE Left 10/10/2019   Procedure: SURGICAL WOUND PREP (INCL SUBQ TISSUE), OR INCISIONAL RELEASE OF SCAR, FACE/SCALP/HEAD/NECK; FIRST 100 SQ CM OR 1% OF BODY OF INFANT/ CHILD;  Surgeon: Hillard Pipe, MD;  [facility];  Service: Ophthalmology;  Laterality: Left;  . bilateral breast biopsy     benign  . BREAST SURGERY    . CARDIAC CATHETERIZATION     2 blockages and then had surgery  . CHOLECYSTECTOMY    . CORONARY ARTERY BYPASS GRAFT     6 years ago   no MI  just felt lightheaded    stress test done and found she needed surgery  no problems since then  . EYE SURGERY    . Hx of MOHs sx    . HYSTERECTOMY     late 90's  .  LAPAROSCOPIC CHOLECYSTECTOMY           MEDICATIONS:  Outpatient Encounter Medications as of 01/01/2024  Medication Sig Dispense Refill  . acetaminophen (TYLENOL) 650 MG ER tablet Take 650 mg by mouth every 8 (eight) hours as needed for Pain    . ALPRAZolam (XANAX) 0.5 MG tablet Take 1 tablet (0.5 mg total) by mouth 2 (two) times daily as needed for Sleep or Anxiety 60 tablet 5  . aspirin 81 MG chewable tablet Take 81 mg by mouth once daily.    SABRA ezetimibe (ZETIA) 10 mg tablet Take 1 tablet (10 mg total) by mouth  once daily 90 tablet 3  . fluticasone propionate (FLONASE) 50 mcg/actuation nasal spray Place 2 sprays into both nostrils at bedtime 16 g 11  . hydroCHLOROthiazide (HYDRODIURIL) 25 MG tablet Take 0.5 tablets (12.5 mg total) by mouth once daily    . meclizine (ANTIVERT) 25 mg tablet Take 1 tablet (25 mg total) by mouth 3 (three) times daily as needed for Dizziness (Patient not taking: Reported on 01/01/2024) 30 tablet 5   Facility-Administered Encounter Medications as of 01/01/2024  Medication Dose Route Frequency Provider Last Rate Last Admin  . cyanocobalamin (VITAMIN B12) injection 1,000 mcg  1,000 mcg Intramuscular Q30 Days Auston Reyes BIRCH, MD   1,000 mcg at 12/20/23 1627     ALLERGIES:   Others, Statins-hmg-coa reductase inhibitors, Zoloft [sertraline], and Amitriptyline   SOCIAL HISTORY:  Social History   Socioeconomic History  . Marital status: Widowed  Tobacco Use  . Smoking status: Former    Current packs/day: 0.00    Average packs/day: 1 pack/day for 15.0 years (15.0 ttl pk-yrs)    Types: Cigarettes    Start date: 3    Quit date: 67    Years since quitting: 42.9  . Smokeless tobacco: Never  . Tobacco comments:    quit smoking 1983  Vaping Use  . Vaping status: Never Used  Substance and Sexual Activity  . Alcohol use: No    Alcohol/week: 0.0 standard drinks of alcohol  . Drug use: No  . Sexual activity: Defer   Social Drivers of Health   Financial Resource Strain: Low Risk  (01/01/2024)   Overall Financial Resource Strain (CARDIA)   . Difficulty of Paying Living Expenses: Not hard at all  Food Insecurity: No Food Insecurity (01/01/2024)   Hunger Vital Sign   . Worried About Programme Researcher, Broadcasting/film/video in the Last Year: Never true   . Ran Out of Food in the Last Year: Never true  Transportation Needs: No Transportation Needs (01/01/2024)   PRAPARE - Transportation   . Lack of Transportation (Medical): No   . Lack of Transportation (Non-Medical): No    FAMILY  HISTORY:  Family History  Problem Relation Name Age of Onset  . No Known Problems Mother    . Coronary Artery Disease (Blocked arteries around heart) Father    . Myocardial Infarction (Heart attack) Father    . Melanoma Brother    . Glaucoma Neg Hx    . Macular degeneration Neg Hx    . Diabetes type II Neg Hx    . Anesthesia problems Neg Hx       GENERAL REVIEW OF SYSTEMS:   General ROS: negative for - chills, fatigue, fever, weight gain or weight loss Allergy and Immunology ROS: negative for - hives  Hematological and Lymphatic ROS: negative for - bleeding problems or bruising, negative for palpable nodes Endocrine ROS: negative for - heat  or cold intolerance, hair changes Respiratory ROS: negative for - cough, shortness of breath or wheezing Cardiovascular ROS: no chest pain or palpitations GI ROS: negative for nausea, vomiting, abdominal pain, diarrhea, constipation Musculoskeletal ROS: negative for - joint swelling or muscle pain Neurological ROS: negative for - confusion, syncope Dermatological ROS: negative for pruritus and rash  PHYSICAL EXAM:  Vitals:   01/01/24 1117  BP: (!) 170/77  Pulse: 70  .  Ht:152.4 cm (5') Wt:66.7 kg (147 lb) ADJ:Anib surface area is 1.68 meters squared. Body mass index is 28.71 kg/m.SABRA   GENERAL: Alert, active, oriented x3  HEENT: Pupils equal reactive to light. Extraocular movements are intact. Sclera clear. Palpebral conjunctiva normal red color.Pharynx clear.  NECK: Supple with no palpable mass and no adenopathy.  LUNGS: Sound clear with no rales rhonchi or wheezes.  HEART: Regular rhythm S1 and S2 without murmur.  BREAST: Both breasts examined in sitting and supine position.  No palpable masses, skin changes, nipple retraction or nipple discharge.  EXTREMITIES: Well-developed well-nourished symmetrical with no dependent edema.  NEUROLOGICAL: Awake alert oriented, facial expression symmetrical, moving all extremities.    Results RADIOLOGY Screening mammogram: Possible mass of the left breast (12/17/2023) Diagnostic mammogram and ultrasound: Suspicious eight millimeter mass at the twelve o'clock position, nine centimeters from the nipple, irregular, hypoechoic  PATHOLOGY Core biopsy: Invasive ductal carcinoma, Grade 2    Assessment & Plan Invasive ductal carcinoma of left breast   Invasive ductal carcinoma of the left breast is confirmed by core biopsy. The mass is at the twelve o'clock position, nine centimeters from the nipple, measuring eight millimeters, irregular, and hypoechoic. It is grade two. Awaiting ER, PR, and HER2 marker results to guide treatment. She prefers a partial mastectomy if feasible due to the small mass size. A physical exam was performed to correlate with imaging findings. Discussed breast cancer surgery options, including partial and total mastectomy. Await marker results and follow up with a medical oncologist for treatment planning.   Malignant neoplasm of central portion of left breast in female, estrogen receptor negative (CMS/HHS-HCC) [C50.112, Z17.1]         I spent a total of 65 minutes in both face-to-face and non-face-to-face activities, excluding procedures performed, for this visit on the date of this encounter.  Patient and her son verbalized understanding, all questions were answered, and were agreeable with the plan outlined above.   Lucas Sjogren, MD  Electronically signed by Lucas Sjogren, MD

## 2024-01-02 ENCOUNTER — Encounter: Payer: Self-pay | Admitting: Oncology

## 2024-01-02 ENCOUNTER — Encounter: Payer: Self-pay | Admitting: *Deleted

## 2024-01-02 ENCOUNTER — Inpatient Hospital Stay

## 2024-01-02 ENCOUNTER — Other Ambulatory Visit: Payer: Self-pay | Admitting: *Deleted

## 2024-01-02 ENCOUNTER — Inpatient Hospital Stay: Attending: Oncology | Admitting: Oncology

## 2024-01-02 VITALS — BP 150/70 | HR 84 | Temp 97.8°F | Resp 16 | Wt 172.0 lb

## 2024-01-02 DIAGNOSIS — C50912 Malignant neoplasm of unspecified site of left female breast: Secondary | ICD-10-CM

## 2024-01-02 DIAGNOSIS — Z171 Estrogen receptor negative status [ER-]: Secondary | ICD-10-CM | POA: Insufficient documentation

## 2024-01-02 DIAGNOSIS — Z1722 Progesterone receptor negative status: Secondary | ICD-10-CM | POA: Diagnosis not present

## 2024-01-02 DIAGNOSIS — Z808 Family history of malignant neoplasm of other organs or systems: Secondary | ICD-10-CM | POA: Insufficient documentation

## 2024-01-02 DIAGNOSIS — Z9189 Other specified personal risk factors, not elsewhere classified: Secondary | ICD-10-CM

## 2024-01-02 DIAGNOSIS — C50812 Malignant neoplasm of overlapping sites of left female breast: Secondary | ICD-10-CM | POA: Diagnosis present

## 2024-01-02 DIAGNOSIS — Z87891 Personal history of nicotine dependence: Secondary | ICD-10-CM | POA: Diagnosis not present

## 2024-01-02 DIAGNOSIS — Z803 Family history of malignant neoplasm of breast: Secondary | ICD-10-CM | POA: Insufficient documentation

## 2024-01-02 DIAGNOSIS — Z1732 Human epidermal growth factor receptor 2 negative status: Secondary | ICD-10-CM | POA: Diagnosis not present

## 2024-01-02 NOTE — Progress Notes (Signed)
 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  INTERVAL HISTORY: Patient is an 83 year old female who was incidentally noted to have a suspicious lesion on routine screening mammogram.  Subsequent ultrasound and biopsy revealed the above-stated malignancy.  She currently feels well and is asymptomatic.  She has no neurologic complaints.  She denies any recent fevers or illnesses.  She has a good appetite and denies weight loss.  She has no chest pain, shortness of breath, cough, or hemoptysis.  She denies any nausea, vomiting, constipation, or diarrhea.  She has no urinary complaints.  Patient offers no specific complaints today.  REVIEW OF SYSTEMS:   Review of Systems  Constitutional: Negative.  Negative for fever, malaise/fatigue and weight loss.  Respiratory: Negative.  Negative for cough, hemoptysis and shortness of breath.   Cardiovascular: Negative.  Negative for chest pain and leg swelling.  Gastrointestinal: Negative.  Negative for abdominal pain.  Genitourinary: Negative.  Negative for dysuria.  Musculoskeletal: Negative.  Negative for back pain.  Skin: Negative.  Negative for rash.  Neurological: Negative.  Negative for dizziness, focal weakness, weakness and headaches.  Psychiatric/Behavioral: Negative.  The patient is not nervous/anxious.     As per HPI. Otherwise, a complete review of systems is negative.  PAST MEDICAL HISTORY: Past Medical History:  Diagnosis Date   Actinic keratosis    Basal cell carcinoma 07/15/2019   Left nasal root. Superficial, ulcerated. MOHs    BCC (basal cell carcinoma) 01/18/2021   Right Medial Eyebrow, EDC 02/16/21    BCC (basal cell carcinoma) 02/13/2023   Left Lower Back, EDC   BCC (basal cell carcinoma) 02/13/2023   Right Thigh - Anterior, EDC   Hx of basal cell carcinoma 10/19/2014   Left upper scapula   Hx of basal cell carcinoma 10/19/2014   Right mid back at braline   Hx of basal cell carcinoma 07/24/2017   Right spinal lower back   Hx of basal cell carcinoma 03/05/2018   Left medial canthus   Hyperlipidemia    Squamous cell carcinoma of skin 04/25/2022   L ant thigh, EDC   Squamous cell carcinoma of skin 08/13/2023   right hand dorsum, treated with EDC    PAST SURGICAL HISTORY: Past Surgical History:  Procedure Laterality Date   ABDOMINAL HYSTERECTOMY     BREAST BIOPSY Bilateral 10+ years ago   Negative   BREAST BIOPSY Left 12/25/2023   US  LT BREAST BX W LOC DEV 1ST LESION IMG BX SPEC US  GUIDE 12/25/2023 [facility]   CORONARY ARTERY BYPASS GRAFT      FAMILY HISTORY: Family History  Problem Relation Age of Onset   Breast cancer Maternal Aunt 60   Melanoma Brother     ADVANCED DIRECTIVES (Y/N):  N  HEALTH MAINTENANCE: Social History   Tobacco Use   Smoking status: Former   Smokeless tobacco: Never  Substance Use Topics   Alcohol use: No   Drug use: No     Colonoscopy:  PAP:  Bone density:  Lipid panel:  Allergies  Allergen Reactions   Statins     Leg cramps   Amitriptyline Rash    Current Outpatient Medications  Medication Sig Dispense Refill   ALPRAZolam (  XANAX) 0.5 MG tablet Take 0.5 mg by mouth 2 (two) times daily as needed.     aspirin 81 MG chewable tablet Chew by mouth.     cyanocobalamin (,VITAMIN B-12,) 1000 MCG/ML injection Inject into the muscle.     ezetimibe (ZETIA) 10 MG tablet Take 1 tablet by mouth daily.     hydrochlorothiazide (HYDRODIURIL) 25 MG tablet Take 25 mg by mouth daily.     albuterol  (PROVENTIL  HFA;VENTOLIN  HFA) 108 (90 Base) MCG/ACT inhaler Inhale 2 puffs into the lungs every 6 (six) hours as needed for wheezing or  shortness of breath. (Patient not taking: Reported on 01/02/2024) 1 Inhaler 0   esomeprazole (NEXIUM) 20 MG capsule Take by mouth. (Patient not taking: Reported on 01/02/2024)     No current facility-administered medications for this visit.    OBJECTIVE: Vitals:   01/02/24 1406  BP: (!) 150/70  Pulse: 84  Resp: 16  Temp: 97.8 F (36.6 C)  SpO2: 97%     Body mass index is 31.46 kg/m.    ECOG FS:0 - Asymptomatic  General: Well-developed, well-nourished, no acute distress. Eyes: Pink conjunctiva, anicteric sclera. HEENT: Normocephalic, moist mucous membranes. Lungs: No audible wheezing or coughing. Heart: Regular rate and rhythm. Abdomen: Soft, nontender, no obvious distention. Musculoskeletal: No edema, cyanosis, or clubbing. Neuro: Alert, answering all questions appropriately. Cranial nerves grossly intact. Skin: No rashes or petechiae noted. Psych: Normal affect. Lymphatics: No cervical, calvicular, axillary or inguinal LAD.   LAB RESULTS:  No results found for: NA, K, CL, CO2, GLUCOSE, BUN, CREATININE, CALCIUM, PROT, ALBUMIN, AST, ALT, ALKPHOS, BILITOT, GFRNONAA, GFRAA  No results found for: WBC, NEUTROABS, HGB, HCT, MCV, PLT   STUDIES: US  LT BREAST BX W LOC DEV 1ST LESION IMG BX SPEC US  GUIDE Addendum Date: 12/27/2023 ADDENDUM REPORT: 12/27/2023 11:57 ADDENDUM: PATHOLOGY revealed: 1. Breast, left, needle core biopsy, 12:00 9 cmfn; 8 mm (venus clip) : - INVASIVE DUCTAL CARCINOMA - OVERALL GRADE: 2 - LYMPHOVASCULAR INVASION: NOT IDENTIFIED - CANCER LENGTH: 0.8 CM - CALCIFICATIONS: NOT IDENTIFIED. Pathology results are CONCORDANT with imaging findings, per Norleen Croak M.D. Pathology results and recommendations were discussed with patient via telephone on 12/26/2023 by Rock Hover RN. Patient reported biopsy site doing well with no adverse symptoms, and slight tenderness at the site. Post biopsy care instructions were reviewed,  questions were answered and my direct phone number was provided. Patient was instructed to call [facility] for any additional questions or concerns related to biopsy site. RECOMMENDATIONS: 1. Surgical and oncological consultation. Request for surgical and oncological consultation relayed to Shasta Ada RN at [facility] (per patient request) by Rock Hover RN on 12/27/2023. Pathology results reported by Rock Hover RN on 12/26/2023. Electronically Signed   By: Norleen Croak M.D.   On: 12/27/2023 11:57   Result Date: 12/27/2023 CLINICAL DATA:  BI-RADS 5 mass of the LEFT breast EXAM: ULTRASOUND GUIDED LEFT BREAST CORE NEEDLE BIOPSY COMPARISON:  Previous exam(s). PROCEDURE: I met with the patient and we discussed the procedure of ultrasound-guided biopsy, including benefits and alternatives. We discussed the high likelihood of a successful procedure. We discussed the risks of the procedure, including infection, bleeding, tissue injury, clip migration, and inadequate sampling. Informed written consent was given. The usual time-out protocol was performed immediately prior to the procedure. Lesion quadrant: Upper-outer Using sterile technique and 1% lidocaine  and 1% lidocaine  with epinephrine  as local anesthetic, under direct ultrasound visualization, a 14 gauge spring-loaded device was used to perform biopsy  of the LEFT breast mass at 12 o'clock 9 cm from the nipple using a MEDIAL approach. At the conclusion of the procedure Venus shaped tissue marker clip was deployed into the biopsy cavity. Follow up 2 view mammogram was performed and dictated separately. IMPRESSION: Ultrasound guided biopsy of the LEFT breast mass at 12 o'clock 9 cm from the nipple. No apparent complications. Electronically Signed: By: Norleen Croak M.D. On: 12/25/2023 11:17   MM CLIP PLACEMENT LEFT Result Date: 12/25/2023 CLINICAL DATA:  Status post LEFT breast ultrasound-guided biopsy EXAM: 3D DIAGNOSTIC LEFT  MAMMOGRAM POST ULTRASOUND BIOPSY COMPARISON:  Previous exam(s). ACR Breast Density Category b: There are scattered areas of fibroglandular density. FINDINGS: 3D Mammographic images were obtained following ultrasound guided biopsy of the LEFT breast mass at 12 o'clock 9 cm from the nipple. The biopsy marking clip is in expected position at the site of biopsy. IMPRESSION: Appropriate positioning of the Venus shaped biopsy marking clip at the site of biopsy in the LEFT breast. Final Assessment: Post Procedure Mammograms for Marker Placement Electronically Signed   By: Norleen Croak M.D.   On: 12/25/2023 11:50   MM 3D DIAGNOSTIC MAMMOGRAM UNILATERAL LEFT BREAST Result Date: 12/20/2023 CLINICAL DATA:  LEFT breast mass callback EXAM: DIGITAL DIAGNOSTIC UNILATERAL LEFT MAMMOGRAM WITH TOMOSYNTHESIS AND CAD; ULTRASOUND LEFT BREAST LIMITED TECHNIQUE: Left digital diagnostic mammography and breast tomosynthesis was performed. The images were evaluated with computer-aided detection. ; Targeted ultrasound examination of the left breast was performed. COMPARISON:  Previous exam(s). ACR Breast Density Category b: There are scattered areas of fibroglandular density. FINDINGS: Spot compression tomosynthesis views demonstrates a persistent irregular mass with spiculated margins and associated architectural distortion. It is best seen on spot CC slice 15 and ML slice 20. On physical exam, no discrete mass is appreciated. Targeted ultrasound was performed of the LEFT upper breast. At 12 o'clock 9 cm from the nipple, there is an irregular hypoechoic mass with indistinct margins and an echogenic rind. This measures 8 x 5 by 4 mm and corresponds well to the site of mammographic concern. Targeted ultrasound was performed of the LEFT axilla. No suspicious axillary lymph nodes are visualized. IMPRESSION: 1. There is a suspicious 8 mm LEFT breast mass at the site of screening mammographic concern. Recommend ultrasound-guided biopsy for  definitive characterization 2. No suspicious LEFT axillary adenopathy RECOMMENDATION: LEFT breast ultrasound-guided biopsy x1 I have discussed the findings and recommendations with the patient. The biopsy procedure was discussed with the patient and questions were answered. Patient expressed their understanding of the biopsy recommendation. Patient will be scheduled for biopsy at her earliest convenience by the schedulers. Ordering provider will be notified. If applicable, a reminder letter will be sent to the patient regarding the next appointment. BI-RADS CATEGORY  5: Highly suggestive of malignancy. Electronically Signed   By: Corean Salter M.D.   On: 12/20/2023 14:40   US  LIMITED ULTRASOUND INCLUDING AXILLA LEFT BREAST  Result Date: 12/20/2023 CLINICAL DATA:  LEFT breast mass callback EXAM: DIGITAL DIAGNOSTIC UNILATERAL LEFT MAMMOGRAM WITH TOMOSYNTHESIS AND CAD; ULTRASOUND LEFT BREAST LIMITED TECHNIQUE: Left digital diagnostic mammography and breast tomosynthesis was performed. The images were evaluated with computer-aided detection. ; Targeted ultrasound examination of the left breast was performed. COMPARISON:  Previous exam(s). ACR Breast Density Category b: There are scattered areas of fibroglandular density. FINDINGS: Spot compression tomosynthesis views demonstrates a persistent irregular mass with spiculated margins and associated architectural distortion. It is best seen on spot CC slice 15 and ML slice 20. On  physical exam, no discrete mass is appreciated. Targeted ultrasound was performed of the LEFT upper breast. At 12 o'clock 9 cm from the nipple, there is an irregular hypoechoic mass with indistinct margins and an echogenic rind. This measures 8 x 5 by 4 mm and corresponds well to the site of mammographic concern. Targeted ultrasound was performed of the LEFT axilla. No suspicious axillary lymph nodes are visualized. IMPRESSION: 1. There is a suspicious 8 mm LEFT breast mass at the site of  screening mammographic concern. Recommend ultrasound-guided biopsy for definitive characterization 2. No suspicious LEFT axillary adenopathy RECOMMENDATION: LEFT breast ultrasound-guided biopsy x1 I have discussed the findings and recommendations with the patient. The biopsy procedure was discussed with the patient and questions were answered. Patient expressed their understanding of the biopsy recommendation. Patient will be scheduled for biopsy at her earliest convenience by the schedulers. Ordering provider will be notified. If applicable, a reminder letter will be sent to the patient regarding the next appointment. BI-RADS CATEGORY  5: Highly suggestive of malignancy. Electronically Signed   By: Corean Salter M.D.   On: 12/20/2023 14:40   MM 3D SCREENING MAMMOGRAM BILATERAL BREAST Result Date: 12/18/2023 CLINICAL DATA:  Screening. EXAM: DIGITAL SCREENING BILATERAL MAMMOGRAM WITH TOMOSYNTHESIS AND CAD TECHNIQUE: Bilateral screening digital craniocaudal and mediolateral oblique mammograms were obtained. Bilateral screening digital breast tomosynthesis was performed. The images were evaluated with computer-aided detection. COMPARISON:  Previous exam(s). ACR Breast Density Category b: There are scattered areas of fibroglandular density. FINDINGS: In the left breast, a possible mass warrants further evaluation. In the right breast, no findings suspicious for malignancy. IMPRESSION: Further evaluation is suggested for a possible mass in the left breast. RECOMMENDATION: Diagnostic mammogram and possibly ultrasound of the left breast. (Code:[id number]-11M) The patient will be contacted regarding the findings, and additional imaging will be scheduled. BI-RADS CATEGORY  0: Incomplete: Need additional imaging evaluation. Electronically Signed   By: Alm Parkins M.D.   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  She had consultation with surgery earlier this week.  Neoadjuvant chemotherapy can be used in triple negative malignancy, but given the small size and patient's advanced age, will pursue lumpectomy first.  Adjuvant chemotherapy was discussed today, but once again given the small size of malignancy and patient advanced age, will further discuss treatment options after surgery.  Since patient plans to have a lumpectomy, she will benefit from adjuvant XRT.  Aromatase inhibitor would be of no help given the ER/PR status of her malignancy.  No further intervention is needed.  Return to clinic approximately 2 weeks after her surgery to discuss final pathology results and additional treatment planning.  I spent a total of 60 minutes reviewing chart data, face-to-face evaluation with the patient, counseling and coordination of care as detailed above.  Patient expressed understanding and was in agreement with this plan. She also understands that She can call clinic at any time with any questions, concerns, or complaints.    Cancer Staging  Invasive ductal carcinoma of left breast [facility]) Staging form: Breast, AJCC 8th Edition - Clinical stage from 01/02/2024: Stage IB (cT1b, cN0, cM0, G2, ER-, PR-, HER2-) - Signed by Jacobo Evalene PARAS, MD on 01/02/2024 Stage prefix: Initial diagnosis Mitotic count  score: Score 2 Histologic grading system: 3 grade system   Evalene JINNY Reusing, MD   01/04/2024 8:42 AM

## 2024-01-02 NOTE — Progress Notes (Signed)
 Accompanied patient and family to initial medical oncology appointment.   Reviewed Breast Cancer treatment handbook.   Care plan summary given to patient.   Reviewed outreach programs and cancer center services.

## 2024-01-07 ENCOUNTER — Ambulatory Visit: Attending: Internal Medicine

## 2024-01-07 ENCOUNTER — Encounter: Payer: Self-pay | Admitting: *Deleted

## 2024-01-07 ENCOUNTER — Other Ambulatory Visit: Payer: Self-pay | Admitting: General Surgery

## 2024-01-07 DIAGNOSIS — L905 Scar conditions and fibrosis of skin: Secondary | ICD-10-CM | POA: Insufficient documentation

## 2024-01-07 DIAGNOSIS — R2681 Unsteadiness on feet: Secondary | ICD-10-CM | POA: Diagnosis present

## 2024-01-07 DIAGNOSIS — R42 Dizziness and giddiness: Secondary | ICD-10-CM | POA: Diagnosis present

## 2024-01-07 DIAGNOSIS — Z171 Estrogen receptor negative status [ER-]: Secondary | ICD-10-CM

## 2024-01-07 NOTE — Progress Notes (Signed)
 Lumpectomy has been scheduled for 12/5.   Caitlin Roy will see Dr. Jacobo on 01/25/24.

## 2024-01-07 NOTE — Therapy (Signed)
 OUTPATIENT PHYSICAL THERAPY TREATMENT Patient Name: Caitlin Roy MRN: [id number] DOB:August 27, 1940, 83 y.o., female Today's Date: 01/07/2024  END OF SESSION:  PT End of Session - 01/07/24 1134     Visit Number 5    Number of Visits 16    Date for Recertification  02/07/24    Authorization Type Medicare    Authorization Time Period 12/13/23-02/07/24    Progress Note Due on Visit 10    PT Start Time 1130    PT Stop Time 1210    PT Time Calculation (min) 40 min    Equipment Utilized During Treatment Gait belt    Activity Tolerance Patient tolerated treatment well;No increased pain    Behavior During Therapy [facility] for tasks assessed/performed          Past Medical History:  Diagnosis Date   Actinic keratosis    Basal cell carcinoma 07/15/2019   Left nasal root. Superficial, ulcerated. MOHs    BCC (basal cell carcinoma) 01/18/2021   Right Medial Eyebrow, EDC 02/16/21   BCC (basal cell carcinoma) 02/13/2023   Left Lower Back, EDC   BCC (basal cell carcinoma) 02/13/2023   Right Thigh - Anterior, EDC   Hx of basal cell carcinoma 10/19/2014   Left upper scapula   Hx of basal cell carcinoma 10/19/2014   Right mid back at braline   Hx of basal cell carcinoma 07/24/2017   Right spinal lower back   Hx of basal cell carcinoma 03/05/2018   Left medial canthus   Hyperlipidemia    Squamous cell carcinoma of skin 04/25/2022   L ant thigh, EDC   Squamous cell carcinoma of skin 08/13/2023   right hand dorsum, [facility] with [facility]   Past Surgical History:  Procedure Laterality Date   ABDOMINAL HYSTERECTOMY     BREAST BIOPSY Bilateral 10+ years ago   Negative   BREAST BIOPSY Left 12/25/2023   US  LT BREAST BX W LOC DEV 1ST LESION IMG BX SPEC US  GUIDE 12/25/2023 [facility]   CORONARY ARTERY BYPASS GRAFT     Patient Active Problem List   Diagnosis Date Noted   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 HEP going fine. Pt has felt generally a bit on the verge of exacerbation, but not sure why. No frank aggravation with upward gaze/glance in kitchen. Pt having breast surgery on Friday, so she canceled her visit scheduled for next week. Pt also had to cancel her optometry consult.    PERTINENT HISTORY:   Caitlin Roy is an 83yoF who reports illness in Feb 2025, s/p prednisone x2, then after sickness experienced insidious onset vertigo, soon made it to ENT, where they treated her 2, reported some BPPV, but indicated that she needed more vestibular rehab than they could provide. Pt has continued to have significant fluctuating disequilibrium since that time, albeit she denies any frank vertiginous symptoms, she does say there are frequent moments where she feels it coming on but it never does. Pt is limited as a historian, particularly in linear timeline of course of illness and also with relative frequency of symptoms, however she is quite colorful in her description of symptoms. She remarks  on frequent fluctuations with Rt eye puffiness and Rt ear fullness feelings that relate to her symptoms. Pt says she has visual distortion on some days that precludes the ability to read text. Pt reports symptoms are almost always worse in the morning. Pt denies any falls in past 6 months, denies any close calls pertaining to this. 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   PAIN:  Are you having pain? No  PRECAUTIONS: None  WEIGHT BEARING RESTRICTIONS: No  FALLS: Has patient fallen in last 6 months? No  PATIENT GOALS:  be able to fully move head for kitchen and garden tasks without disrupting her equilibrium.   OBJECTIVE:  Note: Objective measures were completed at Evaluation unless otherwise noted.  DIAGNOSTIC FINDINGS:  Pt reports a normal Head CT   PATIENT SURVEYS:  DHI: 12/18/23:    VESTIBULAR ASSESSMENT (from visit 1): Occulomotor screening:  pt has normal smooth pursuits moving Left to right, choppy Right to left, and very disrupted smooth pursuits moving up to/from down. -pt has nystagmus with sustained left gaze, left beating.  Saccade testing: -consistent mild undershooting left and right  -vertical saccades are less organized difficult to characterize   -baseline visual acuity: Left 20/30 vision, Rt 20/75 (recommended pt follow up with eye doctor about this) (Need to repeat test for correct administration on visit 2)   Head shake test: -negative (can read same line of text)   Head Thrust Test: (+) right test, (-) left test   Test of skew: -mild lateral deviation WNL, negative test  Canal Testing: *used IR goggle video for testing  -head roll test, negative bilat -Dix hallpike test (+) Rt side only with Lt beating horizontal nystagmus.  -Pt symptomatic on Rt DixHallpike only.                                                                                                                            TREATMENT DATE 01/07/24 :   -seated pencil pushups with 15 playing cards 15x3secH (<8)  -standing gaze stabilization c hor head turns 1x45sec  -standing gaze stabilization c vert head turns 1x45sec  -VOR cancelation with horizontal head mvt x45sec (laser headlamp), simple background  -seated pencil pushups with 15 playing cards 15x3secH (<8)  -standing gaze stabilization c hor head turns 1x45sec  -standing gaze stabilization c vert head turns 1x45sec  -VOR cancelation with horizontal head mvt x45sec (laser headlamp), pattern background -VOR cancelation with vertical head mvt  x45sec (laser headlamp), pattern background  -AMB in hallway with head turns to call out post its letters 6x74ft at normal walking pace,  -AMB in hallway with ball toss/catch 4x13ft -stance on foam with head turns to call out cards x10 Rt, 10x left, 10x overhead (no LOB, no dizziness)    PATIENT EDUCATION: Education details: See above Person educated: [name] Education method: collaborative learning,  deliberate practice, positive reinforcement, explicit instruction, establish rules. Education comprehension: Fair  HOME EXERCISE PROGRAM: Access Code: [facility] URL: [email or web address] Date: 12/18/2023 Prepared by: Peggye Linear  Exercises - Seated Gaze Stabilization with Head Nod  - 2 x daily - 7 x weekly - 3 sets - 30sec hold - Seated Gaze Stabilization with Head Nod  - 2 x daily - 7 x weekly - 3 sets - 30sec hold  GOALS: Goals reviewed with patient? No  SHORT TERM GOALS: Target date: 01/12/24  Negative canal testing to indicated resolved BPPV.  Baseline: (+) Rt dix hallpike  Goal status: new  2.  Pt to improve score on DHI by >10 Baseline: pending visit 2  Goal status: INITIAL  3.  Pt to reports consistent performance with VOR retraining exercises at home >4d/week.  Baseline: No performing Goal status: INITIAL  LONG TERM GOALS: Target date: 02/07/24 Pt to demonstrate ability to perform 5x60sec head nods with gaze stabilization on target at 2hz  or greater without loss of target focus and without increased symptoms.  Baseline:  Goal status: INITIAL  2.  Pt to reports >75% improve in ability to look up/down in kitchen without triggering disequilibrium.  Baseline: fearful of move head.  Goal status: INITIAL  3.  Pt to reports >75% improvement in symptoms associate with early morning bed routine and waking.  Baseline: bed in morning is worst symptom time.  Goal status: INITIAL  ASSESSMENT:  CLINICAL IMPRESSION: Pt conitnues to do very well with all  interventions, if anything, just challenged by dual cogintive aspects, but no specific symptoms aggravation. Pt able to complete entire session without any increased dizziness. Discussed possible transition to 2x/monthly going forward. Patient will benefit from skilled physical therapy intervention to reduce deficits and impairments identified in evaluation, in order to reduce pain, improve quality of life, and maximize activity tolerance for ADL, IADL, and leisure/fitness. Physical therapy will help pt achieve long and short term goals of care.   OBJECTIVE IMPAIRMENTS: Decreased knowledge of condition, decreased use of DME, decreased mobility, difficulty walking, decreased strength, decreased ROM. ACTIVITY LIMITATIONS: Lifting, standing, walking, squatting, transfers, locomotion level PARTICIPATION LIMITATIONS: Cleaning, laundry, interpersonal relationships, driving, yardwork, community activity.  PERSONAL FACTORS: Age, behavior pattern, education, past/current experiences, transportation, profession  are also affecting patient's functional outcome.  REHAB POTENTIAL: Good CLINICAL DECISION MAKING: Medium  EVALUATION COMPLEXITY: Moderate   PLAN:  PT FREQUENCY: 1-2x/week  PT DURATION: 8 weeks  PLANNED INTERVENTIONS: 97110-Therapeutic exercises, 97530- Therapeutic activity, 97112- Neuromuscular re-education, 97535- Self Care, [id number]- Manual therapy, [phone]- Gait training, [phone]- Canalith repositioning, Patient/Family education, Balance training, Stair training, Vestibular training, Visual/preceptual remediation/compensation, and Cryotherapy  PLAN FOR NEXT SESSION:  -FU on rescheduling optometry consult    11:40 AM, 01/07/24 Peggye JAYSON Linear, PT, DPT Physical Therapist - [facility]  Outpatient Physical Therapy- Main Campus [phone]     Catlyn Shipton C, PT 01/07/2024, 11:40 AM

## 2024-01-08 ENCOUNTER — Other Ambulatory Visit: Payer: Self-pay | Admitting: General Surgery

## 2024-01-08 ENCOUNTER — Inpatient Hospital Stay: Admission: RE | Admit: 2024-01-08 | Discharge: 2024-01-08 | Attending: General Surgery

## 2024-01-08 DIAGNOSIS — C50112 Malignant neoplasm of central portion of left female breast: Secondary | ICD-10-CM | POA: Insufficient documentation

## 2024-01-08 DIAGNOSIS — Z171 Estrogen receptor negative status [ER-]: Secondary | ICD-10-CM | POA: Insufficient documentation

## 2024-01-08 HISTORY — PX: BREAST BIOPSY: SHX20

## 2024-01-08 MED ORDER — LIDOCAINE HCL 1 % IJ SOLN
5.0000 mL | Freq: Once | INTRAMUSCULAR | Status: AC
  Administered 2024-01-08: 5 mL

## 2024-01-09 ENCOUNTER — Other Ambulatory Visit: Payer: Self-pay

## 2024-01-09 ENCOUNTER — Inpatient Hospital Stay: Admission: RE | Admit: 2024-01-09 | Discharge: 2024-01-09 | Attending: General Surgery

## 2024-01-09 ENCOUNTER — Encounter: Payer: Self-pay | Admitting: General Surgery

## 2024-01-09 ENCOUNTER — Ambulatory Visit: Payer: Self-pay | Admitting: General Surgery

## 2024-01-09 HISTORY — DX: Other specified health status: Z78.9

## 2024-01-09 HISTORY — DX: Thrombocytosis, unspecified: D75.839

## 2024-01-09 HISTORY — DX: Radiculopathy, cervical region: M54.12

## 2024-01-09 HISTORY — DX: Other cervical disc degeneration, unspecified cervical region: M50.30

## 2024-01-09 HISTORY — DX: Deficiency of other specified B group vitamins: E53.8

## 2024-01-09 HISTORY — DX: Essential (primary) hypertension: I10

## 2024-01-09 HISTORY — DX: Unspecified osteoarthritis, unspecified site: M19.90

## 2024-01-09 HISTORY — DX: Long term (current) use of aspirin: Z79.82

## 2024-01-09 HISTORY — DX: Gastro-esophageal reflux disease without esophagitis: K21.9

## 2024-01-09 HISTORY — DX: Pneumonia, unspecified organism: J18.9

## 2024-01-09 HISTORY — DX: Anxiety disorder, unspecified: F41.9

## 2024-01-09 HISTORY — DX: Other secondary cataract, unspecified eye: H26.499

## 2024-01-09 HISTORY — DX: Disorder of arteries and arterioles, unspecified: I77.9

## 2024-01-09 HISTORY — DX: Diffuse cystic mastopathy of unspecified breast: N60.19

## 2024-01-09 HISTORY — DX: Diaphragmatic hernia without obstruction or gangrene: K44.9

## 2024-01-09 NOTE — Progress Notes (Signed)
 Perioperative / Anesthesia Services  Pre-Admission Testing Clinical Review / Pre-Operative Anesthesia Consult  Date: 01/09/24  PATIENT DEMOGRAPHICS: Name: Caitlin Roy DOB: 1940/12/15 MRN:   [id number]  Note: Available PAT nursing documentation and vital signs have been reviewed. Clinical nursing staff has updated patient's PMH/PSHx, current medication list, and drug allergies/intolerances to ensure complete and comprehensive history available to assist care teams in MDM as it pertains to the aforementioned surgical procedure and anticipated anesthetic course. Extensive review of available clinical information personally performed. Nursing documentation reviewed. [place] PMH and PSHx updated with any diagnoses and/or procedures that I have knowledge of that may have been inadvertently omitted during her intake with the pre-admission testing department's nursing staff.  PLANNED SURGICAL PROCEDURE(S):   Case: [id number] Date/Time: 01/11/24 1227   Procedures:      BREAST LUMPECTOMY WITH RADIO FREQUENCY LOCALIZER (Left)     BIOPSY, LYMPH NODE, SENTINEL (Left: Breast)   Anesthesia type: General   Pre-op diagnosis: C50.112 Z17.1 malignant neoplasm of central portion of lt breast in female estrogen receptor negative   Location: [facility] OR ROOM 04 / [facility]   Surgeons: Rodolph Romano, MD        CLINICAL DISCUSSION: Caitlin Roy is a 83 y.o. female who is submitted for pre-surgical anesthesia review and clearance prior to her undergoing the above procedure. Patient is a Former Games Developer. Pertinent PMH includes: CAD (s/p CABG), HTN, HLD, BILATERAL carotid artery disease, chronic cerebral microvascular disease, GERD (no daily Tx), hiatal hernia, thrombocytosis, invasive ductal carcinoma of the LEFT breast, fibrocystic breast disease, OA, cervical DDD, anxiety (on BZO).  Patient is followed by cardiology Philippe, MD). She was last seen in the cardiology clinic on  08/01/2023; notes reviewed. At the time of her clinic visit, patient doing well overall from a cardiovascular perspective.  Patient complaining of vertiginous symptoms.  She had recently been evaluated by ENT for BPPV.  Patient denied any chest pain, shortness of breath, PND, orthopnea, palpitations, significant peripheral edema, weakness, fatigue, vertiginous symptoms, or presyncope/syncope. Patient with a past medical history significant for cardiovascular diagnoses. Documented physical exam was grossly benign, providing no evidence of acute exacerbation and/or decompensation of the patient's known cardiovascular conditions.  Patient underwent diagnostic LEFT heart catheterization on 06/21/2007.  Study revealed multivessel CAD: 80% pLAD-1, 95% pLAD-2, 75% pLAD-3, 20% OM1, and 100% proximal RCA.  Given the degree and complexity of patient's coronary artery disease, patient was referred to CVTS for consideration of revascularization.  Patient underwent multivessel revascularization on 07/10/2007.  LIMA-LAD, and SVG-PDA bypass grafts were placed.  Most recent TTE performed on 05/14/2019 revealed a normal left ventricular systolic function with an EF of >55% %. There was underwent LVH.  There were no regional wall motion abnormalities. Left ventricular diastolic Doppler parameters were normal. Right ventricular size and function normal. RVSP = 33.0 mmHg.  There was mild mitral, tricuspid, and pulmonary valve regurgitation.  All transvalvular gradients were noted to be normal providing no evidence of hemodynamically significant valvular stenosis. Aorta normal in size with no evidence of ectasia or aneurysmal dilatation.  Most recent myocardial perfusion imaging study was performed on 05/14/2019 revealing a  normal left ventricular systolic function with a hyperdynamic LVEF of 75% there were no regional wall motion abnormalities.  No artifact or left ventricular cavity size enlargement appreciated on review of  imaging. SPECT images demonstrated no evidence of stress-induced myocardial ischemia or arrhythmia; no scintigraphic evidence of scar.  TID ratio = 0.98 (normal range </=  1.2). Study determined to be normal and low risk.  Blood pressure reasonably controlled at 138/78 mmHg on currently prescribed diuretic (HCTZ) monotherapy.  Patient is statin intolerant, therefore her HLD is being treated with ezetimibe for further ASCVD prevention.  Patient is not diabetic.  She does not have an OSAH diagnosis.  Functional capacity somewhat limited by patient's age and multiple medical comorbidities.  With that said, patient able to complete all of her ADLs/IADLs without cardiovascular limitation.  Per the DASI, patient able to achieve at least 4 METS of physical activity without experiencing any significant degree of angina/anginal equivalent symptoms. No changes were made to her medication regimen during her visit with cardiology.  Patient scheduled to follow-up with outpatient cardiology in 12 months or sooner if needed.  Denia A Manfred underwent recent breast biopsy that was positive for stage Ib (cT1b, cN0, cM0, G2, ER-, PR-, and HER2/neu-) invasive ductal carcinoma of the LEFT breast.  Patient scheduled for consult with general surgery for consultation regarding surgical resection.  Patient subsequently scheduled for BREAST LUMPECTOMY WITH RADIO FREQUENCY LOCALIZER (Left) BIOPSY, LYMPH NODE, SENTINEL (Left: Breast) on 01/11/2024 with Dr. Lucas Sjogren, MD. Given patient's past medical hi disease, so yeah 1 does not come until 330 always one of the end then I do not know about that 1 yet I had not heard anything about story significant for cardiovascular diagnoses, presurgical cardiac clearance was sought by the PAT team. Per cardiology, this patient is optimized for surgery and may proceed with the planned procedural course with a LOW risk of significant perioperative cardiovascular complications.  In review of  the patient's medication reconciliation, it is noted that she is on daily oral antithrombotic therapy. Given that patient's past medical history is significant for cardiovascular diagnoses, cardiology recommending that patient continue her daily low-dose ASA throughout her perioperative course.  Patient denies previous perioperative complications with anesthesia in the past. In review her EMR, it is noted that patient underwent a MAC anesthetic course at [facility] ([facility]A III) in 10/2019 without documented complications.   MOST RECENT VITAL SIGNS:    01/09/2024   11:36 AM 01/02/2024    2:06 PM 08/01/2022   11:33 AM  Vitals with BMI  Height 5' 2    Weight 171 lbs 15 oz 172 lbs   BMI 31.44    Systolic  150 128  Diastolic  70 74  Pulse  84 68   PROVIDERS/SPECIALISTS: NOTE: Primary physician provider listed below. Patient may have been seen by APP or partner within same practice.   PROVIDER ROLE / SPECIALTY LAST SHERLEAN Sjogren Lucas, MD General Surgery (Surgeon) 01/01/2024  Auston Reyes BIRCH, MD Primary Care Provider 11/21/2023  Florencio Shine, MD Cardiology 08/01/2023  Jacobo Lye, MD Medical Oncology 01/02/2024   ALLERGIES: Allergies  Allergen Reactions   Statins     Leg cramps   Amitriptyline Rash    CURRENT HOME MEDICATIONS: No current facility-administered medications for this encounter.    ALPRAZolam (XANAX) 0.5 MG tablet   aspirin 81 MG chewable tablet   ezetimibe (ZETIA) 10 MG tablet   hydrochlorothiazide (HYDRODIURIL) 25 MG tablet   HISTORY: Past Medical History:  Diagnosis Date   Actinic keratosis    Anxiety    a.) on BZO (alprazolam) PRN   Arthritis    B12 deficiency    Basal cell carcinoma 07/15/2019   Left nasal root. Superficial, ulcerated. MOHs    BCC (basal cell carcinoma) 01/18/2021   Right Medial Eyebrow, EDC  02/16/21   BCC (basal cell carcinoma) 02/13/2023   Left Lower Back, EDC   BCC (basal cell carcinoma)  02/13/2023   Right Thigh - Anterior, EDC   Bilateral carotid artery disease    BPPV (benign paroxysmal positional vertigo)    Cerebral microvascular disease    Cervical radiculopathy    Coronary artery disease 06/21/2007   a.) LHC 06/21/2007: 80/95/75% pLAD, 20% OM1, 100% pRCA --> CVTS consult; b.) s/p 2v CABG 07/10/2007 (LIMA-LAD, SVG-PDA)   DDD (degenerative disc disease), cervical    Fibrocystic disease of breast    GERD (gastroesophageal reflux disease)    Hiatal hernia    Hx of basal cell carcinoma 10/19/2014   Left upper scapula   Hx of basal cell carcinoma 10/19/2014   Right mid back at braline   Hx of basal cell carcinoma 07/24/2017   Right spinal lower back   Hx of basal cell carcinoma 03/05/2018   Left medial canthus   Hyperlipidemia    Hypertension    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   a.) Bx (+) for stage IB (cT1b, cN0, cM0, G2, ER-, PR-, HER2-)   Long-term use of aspirin therapy    Pneumonia    Posterior capsular opacification    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   a.) LIMA-LAD, SVG-PDA   Squamous cell carcinoma of skin 04/25/2022   L ant thigh, EDC   Squamous cell carcinoma of skin 08/13/2023   right hand dorsum, treated with EDC   Statin intolerance    Thrombocytosis    Past Surgical History:  Procedure Laterality Date   ABDOMINAL HYSTERECTOMY     BREAST BIOPSY Bilateral 10+ years ago   Negative   BREAST BIOPSY Left 12/25/2023   US  LT BREAST BX W LOC DEV 1ST LESION IMG BX SPEC US  GUIDE 12/25/2023 [facility]   BREAST BIOPSY Left 01/08/2024   US  LT BREAST SAVI/RF TAG 1ST LESION US  GUIDE 01/08/2024 [facility]   CORONARY ARTERY BYPASS GRAFT N/A 07/10/2007   LEFT HEART CATH AND CORONARY ANGIOGRAPHY Left 06/21/2007   Procedure: LEFT HEART CATH AND CORONARY ANGIOGRAPHY; Location: [facility]; Surgeon: Wolm Rhyme, MD   Family History  Problem Relation Age of Onset   Breast cancer Maternal Aunt 64   Melanoma Brother    Social History    Tobacco Use   Smoking status: Former   Smokeless tobacco: Never  Substance Use Topics   Alcohol use: No   LABS:  Component Ref Range & Units 10/29/2023 Comments  WBC (White Blood Cell Count) 4.1 - 10.2 10^3/uL 7.7   RBC (Red Blood Cell Count) 4.04 - 5.48 10^6/uL 5.28   Hemoglobin 12.0 - 15.0 gm/dL 83.8 High    Hematocrit 35.0 - 47.0 % 48.3 High    MCV (Mean Corpuscular Volume) 80.0 - 100.0 fl 91.5   MCH (Mean Corpuscular Hemoglobin) 27.0 - 31.2 pg 30.5   MCHC (Mean Corpuscular Hemoglobin Concentration) 32.0 - 36.0 gm/dL 66.6   Platelet Count 849 - 450 10^3/uL 748 High  Consistent with patient history  RDW-CV (Red Cell Distribution Width) 11.6 - 14.8 % 13.1   MPV (Mean Platelet Volume) 9.4 - 12.4 fl 10.1   Neutrophils 1.50 - 7.80 10^3/uL 5.38   Lymphocytes 1.00 - 3.60 10^3/uL 1.10   Monocytes 0.00 - 1.50 10^3/uL 0.77   Eosinophils 0.00 - 0.55 10^3/uL 0.32   Basophils 0.00 - 0.09 10^3/uL 0.05   Neutrophil % 32.0 - 70.0 % 70.1 High    Lymphocyte % 10.0 -  50.0 % 14.4   Monocyte % 4.0 - 13.0 % 10.1   Eosinophil % 1.0 - 5.0 % 4.2   Basophil% 0.0 - 2.0 % 0.7   Immature Granulocyte % <=0.7 % 0.5   Immature Granulocyte Count <=0.06 10^3/L 0.04    Component Ref Range & Units 11/19/2023  Glucose 70 - 110 mg/dL 70  Sodium 863 - 854 mmol/L 141  Potassium 3.6 - 5.1 mmol/L 4.7  Chloride 97 - 109 mmol/L 104  Carbon Dioxide (CO2) 22.0 - 32.0 mmol/L 33.4 High   Urea Nitrogen (BUN) 7 - 25 mg/dL 14  Creatinine 0.6 - 1.1 mg/dL 1.0  Glomerular Filtration Rate (eGFR) >60 mL/min/1.73sq m 56 Low   Calcium 8.7 - 10.3 mg/dL 9.7  Albumin 3.5 - 4.8 g/dL 4.1  Phosphorus 2.5 - 5.0 mg/dL 3.6   ECG: Date: 93/74/7974  Time ECG obtained: 1107 AM Rate: 80 bpm Rhythm: normal sinus Axis (leads I and aVF): normal Intervals: PR 198 ms. QRS 92 ms. QTc 454 ms. ST segment and T wave changes: No evidence of acute T wave abnormalities or significant ST segment elevation  or depression.  Evidence of a possible, age undetermined, prior infarct:  No Comparison: Similar to previous tracing obtained on 08/03/2022   IMAGING / PROCEDURES: MYOCARDIAL PERFUSION IMAGING STUDY (LEXISCAN) performed on 05/14/2019 Normal left ventricular systolic function with a hyperdynamic LVEF of 75% Normal myocardial thickening and wall motion Left ventricular cavity size normal SPECT images demonstrate homogenous tracer distribution throughout the myocardium No evidence of stress-induced myocardial ischemia or arrhythmia Normal low risk study  TRANSTHORACIC ECHOCARDIOGRAM performed on 05/14/2019 Normal left ventricular systolic function with an EF of >55% Moderate LVH No regional wall motion abnormalities Normal right ventricular size and function Mild mitral annular calcification Mild MR, TR, PR RVSP = 33.0 mmHg Normal gradients; no valvular stenosis No pericardial effusion  IMPRESSION AND PLAN: Caitlin Roy has been referred for pre-anesthesia review and clearance prior to her undergoing the planned anesthetic and procedural courses. Available labs, pertinent testing, and imaging results were personally reviewed by me in preparation for upcoming operative/procedural course. [facility] medical record has been updated following extensive record review and patient interview with PAT staff.   This patient has been appropriately cleared by cardiology with an overall LOW risk of patient experiencing significant perioperative cardiovascular complications. here at [facility]. Based on clinical review performed today (01/09/24), barring any significant acute changes in the patient's overall condition, it is anticipated that she will be able to proceed with the planned surgical intervention. Any acute changes in clinical condition may necessitate her procedure being postponed and/or cancelled. Patient will meet with anesthesia team (MD and/or CRNA) on  the day of her procedure for preoperative evaluation/assessment. Questions regarding anesthetic course will be fielded at that time.   Pre-surgical instructions were reviewed with the patient during his PAT appointment, and questions were fielded to satisfaction by PAT clinical staff. She has been instructed on which medications that she will need to hold prior to surgery, as well as the ones that have been deemed safe/appropriate to take on the day of her procedure. As part of the general education provided by PAT, patient made aware both verbally and in writing, that she would need to abstain from the use of any illegal substances during her perioperative course. She was advised that failure to follow the provided instructions could necessitate case cancellation or result in serious perioperative complications up to and including death.  Patient encouraged to contact PAT and/or her surgeon's office to discuss any questions or concerns that may arise prior to surgery; verbalized understanding.   Dorise Pereyra, MSN, APRN, FNP-C, CEN [facility]  Perioperative Services Nurse Practitioner Phone: [phone] Fax: [phone] 01/09/24 1:47 PM  NOTE: This note has been prepared using Dragon dictation software. Despite my best ability to proofread, there is always the potential that unintentional transcriptional errors may still occur from this process.

## 2024-01-09 NOTE — Progress Notes (Signed)
 Patient called requesting to have an appointment for labs tomorrow since she will be in the facility. This nurse scheduled the appointment and arranged for the patient to have surgical soap and and Ensure Presurgical drink which she understands will be explained during her PreAdmit Interview by phone later in the afternoon.

## 2024-01-09 NOTE — Patient Instructions (Addendum)
 Your procedure is scheduled on: FRIDAY  DECEMBER 5  Report to the Registration Desk on the 1st floor of the [facility] To find out your arrival time, please call [phone] between 1PM - 3PM on:  THURSDAY  DECEMBER 4  If your arrival time is 6:00 am, do not arrive before that time as the [facility] entrance doors do not open until 6:00 am.  REMEMBER: Instructions that are not followed completely may result in serious medical risk, up to and including death; or upon the discretion of your surgeon and anesthesiologist your surgery may need to be rescheduled.  Do not eat food after midnight the night before surgery.  No gum chewing or hard candies.  One week prior to surgery: Stop Anti-inflammatories (NSAIDS) such as Advil, Aleve, Ibuprofen, Motrin, Naproxen, Naprosyn and Aspirin based products such as Excedrin, Goody's Powder, BC Powder. Stop ANY OVER THE COUNTER supplements until after surgery.  You may however, continue to take Tylenol if needed for pain up until the day of surgery.  Continue taking all of your other prescription medications up until the day of surgery.  ON THE MORNING OF SURGERY DO NOT TAKE ANY MEDICATION  Use inhalers on the day of surgery and bring to the hospital. albuterol  (PROVENTIL  HFA;VENTOLIN  HFA )  No Alcohol for 24 hours before or after surgery.  Do not use any recreational drugs for at least a week (preferably 2 weeks) before your surgery.  Please be advised that the combination of cocaine and anesthesia may have negative outcomes, up to and including death. If you test positive for cocaine, your surgery will be cancelled.  On the morning of surgery brush your teeth with toothpaste and water, you may rinse your mouth with mouthwash if you wish. Do not swallow any toothpaste or mouthwash.  Use an antibacterial soap such as Dial the night before your surgery.   Do not wear jewelry, make-up, hairpins, clips or nail polish.  For welded  (permanent) jewelry: bracelets, anklets, waist bands, etc.  Please have this removed prior to surgery.  If it is not removed, there is a chance that hospital personnel will need to cut it off on the day of surgery.  Do not wear lotions, powders, or perfumes.   Do not shave body hair from the neck down 48 hours before surgery.  Contact lenses, hearing aids and dentures may not be worn into surgery.  Do not bring valuables to the hospital. [facility] is not responsible for any missing/lost belongings or valuables.   Notify your doctor if there is any change in your medical condition (cold, fever, infection).  Wear comfortable clothing (specific to your surgery type) to the hospital.  After surgery, you can help prevent lung complications by doing breathing exercises.  Take deep breaths and cough every 1-2 hours.   If you are being discharged the day of surgery, you will not be allowed to drive home. You will need a responsible individual to drive you home and stay with you for 24 hours after surgery.   If you are taking public transportation, you will need to have a responsible individual with you.  Please call the Pre-admissions Testing Dept. at [phone] if you have any questions about these instructions.  Surgery Visitation Policy:  Patients having surgery or a procedure may have two visitors.  Children under the age of 29 must have an adult with them who is not the patient.   Merchandiser, Retail to address health-related social  needs:  [email or web address]

## 2024-01-10 ENCOUNTER — Encounter: Payer: Self-pay | Admitting: General Surgery

## 2024-01-11 ENCOUNTER — Ambulatory Visit: Admission: RE | Admit: 2024-01-11 | Discharge: 2024-01-11 | Attending: General Surgery

## 2024-01-11 ENCOUNTER — Ambulatory Visit
Admission: RE | Admit: 2024-01-11 | Discharge: 2024-01-11 | Disposition: A | Attending: General Surgery | Admitting: General Surgery

## 2024-01-11 ENCOUNTER — Encounter: Payer: Self-pay | Admitting: General Surgery

## 2024-01-11 ENCOUNTER — Ambulatory Visit: Payer: Self-pay | Admitting: Urgent Care

## 2024-01-11 ENCOUNTER — Inpatient Hospital Stay: Admission: RE | Admit: 2024-01-11 | Discharge: 2024-01-11 | Attending: General Surgery

## 2024-01-11 ENCOUNTER — Other Ambulatory Visit: Payer: Self-pay

## 2024-01-11 ENCOUNTER — Encounter: Payer: Self-pay | Admitting: Urgent Care

## 2024-01-11 ENCOUNTER — Encounter: Admission: RE | Disposition: A | Payer: Self-pay | Attending: General Surgery

## 2024-01-11 DIAGNOSIS — C50112 Malignant neoplasm of central portion of left female breast: Secondary | ICD-10-CM

## 2024-01-11 HISTORY — DX: Other cerebrovascular disease: I67.89

## 2024-01-11 HISTORY — DX: Benign paroxysmal vertigo, unspecified ear: H81.10

## 2024-01-11 HISTORY — PX: BREAST LUMPECTOMY WITH RADIO FREQUENCY LOCALIZER: SHX6897

## 2024-01-11 HISTORY — PX: SENTINEL NODE BIOPSY: SHX6608

## 2024-01-11 SURGERY — BREAST LUMPECTOMY WITH RADIO FREQUENCY LOCALIZER
Anesthesia: General | Site: Breast | Laterality: Left

## 2024-01-11 MED ORDER — PROPOFOL 500 MG/50ML IV EMUL
INTRAVENOUS | Status: DC | PRN
  Administered 2024-01-11: 75 ug/kg/min via INTRAVENOUS

## 2024-01-11 MED ORDER — CHLORHEXIDINE GLUCONATE 0.12 % MT SOLN
15.0000 mL | Freq: Once | OROMUCOSAL | Status: AC
  Administered 2024-01-11: 15 mL via OROMUCOSAL

## 2024-01-11 MED ORDER — HEMOSTATIC AGENTS (NO CHARGE) OPTIME
TOPICAL | Status: DC | PRN
  Administered 2024-01-11: 1 via TOPICAL

## 2024-01-11 MED ORDER — PROPOFOL 1000 MG/100ML IV EMUL
INTRAVENOUS | Status: AC
  Filled 2024-01-11: qty 100

## 2024-01-11 MED ORDER — BUPIVACAINE-EPINEPHRINE (PF) 0.5% -1:200000 IJ SOLN
INTRAMUSCULAR | Status: AC
  Filled 2024-01-11: qty 30

## 2024-01-11 MED ORDER — DEXAMETHASONE SOD PHOSPHATE PF 10 MG/ML IJ SOLN
INTRAMUSCULAR | Status: DC | PRN
  Administered 2024-01-11: 10 mg via INTRAVENOUS

## 2024-01-11 MED ORDER — LACTATED RINGERS IV SOLN: INTRAVENOUS | Status: DC

## 2024-01-11 MED ORDER — TECHNETIUM TC 99M TILMANOCEPT KIT
1.0100 | PACK | Freq: Once | INTRAVENOUS | Status: AC | PRN
  Administered 2024-01-11: 1.01 via INTRADERMAL

## 2024-01-11 MED ORDER — FENTANYL CITRATE (PF) 100 MCG/2ML IJ SOLN
INTRAMUSCULAR | Status: AC
  Filled 2024-01-11: qty 2

## 2024-01-11 MED ORDER — CHLORHEXIDINE GLUCONATE 0.12 % MT SOLN
OROMUCOSAL | Status: AC
  Filled 2024-01-11: qty 15

## 2024-01-11 MED ORDER — CEFAZOLIN SODIUM-DEXTROSE 2-4 GM/100ML-% IV SOLN
INTRAVENOUS | Status: AC
  Filled 2024-01-11: qty 100

## 2024-01-11 MED ORDER — EPHEDRINE SULFATE-NACL 50-0.9 MG/10ML-% IV SOSY
PREFILLED_SYRINGE | INTRAVENOUS | Status: DC | PRN
  Administered 2024-01-11: 10 mg via INTRAVENOUS

## 2024-01-11 MED ORDER — BUPIVACAINE-EPINEPHRINE (PF) 0.5% -1:200000 IJ SOLN
INTRAMUSCULAR | Status: DC | PRN
  Administered 2024-01-11: 30 mL

## 2024-01-11 MED ORDER — TRAMADOL HCL 50 MG PO TABS
50.0000 mg | ORAL_TABLET | Freq: Four times a day (QID) | ORAL | 0 refills | Status: AC | PRN
  Filled 2024-01-11: qty 10, 3d supply, fill #0

## 2024-01-11 MED ORDER — ONDANSETRON HCL 4 MG/2ML IJ SOLN
INTRAMUSCULAR | Status: DC | PRN
  Administered 2024-01-11: 4 mg via INTRAVENOUS

## 2024-01-11 MED ORDER — PROPOFOL 10 MG/ML IV BOLUS
INTRAVENOUS | Status: DC | PRN
  Administered 2024-01-11: 150 mg via INTRAVENOUS

## 2024-01-11 MED ORDER — FENTANYL CITRATE (PF) 100 MCG/2ML IJ SOLN
INTRAMUSCULAR | Status: DC | PRN
  Administered 2024-01-11: 50 ug via INTRAVENOUS

## 2024-01-11 MED ORDER — LIDOCAINE HCL (CARDIAC) PF 100 MG/5ML IV SOSY
PREFILLED_SYRINGE | INTRAVENOUS | Status: DC | PRN
  Administered 2024-01-11: 80 mg via INTRAVENOUS

## 2024-01-11 MED ORDER — ORAL CARE MOUTH RINSE: 15.0000 mL | Freq: Once | OROMUCOSAL | Status: AC

## 2024-01-11 MED ORDER — CEFAZOLIN SODIUM-DEXTROSE 2-4 GM/100ML-% IV SOLN
2.0000 g | INTRAVENOUS | Status: AC
  Administered 2024-01-11: 2 g via INTRAVENOUS

## 2024-01-11 SURGICAL SUPPLY — 30 items
BLADE SURG 15 STRL LF DISP TIS (BLADE) ×3 IMPLANT
CHLORAPREP W/TINT 26 (MISCELLANEOUS) IMPLANT
COVER PROBE GAMMA FINDER SLV (MISCELLANEOUS) ×3 IMPLANT
DERMABOND ADVANCED .7 DNX12 (GAUZE/BANDAGES/DRESSINGS) ×3 IMPLANT
DEVICE DUBIN SPECIMEN MAMMOGRA (MISCELLANEOUS) ×3 IMPLANT
DRAPE LAPAROTOMY 77X122 PED (DRAPES) ×3 IMPLANT
DRAPE LAPAROTOMY TRNSV 106X77 (MISCELLANEOUS) ×3 IMPLANT
ELECTRODE REM PT RTRN 9FT ADLT (ELECTROSURGICAL) ×3 IMPLANT
GLOVE BIO SURGEON STRL SZ 6.5 (GLOVE) ×3 IMPLANT
GLOVE BIOGEL PI IND STRL 6.5 (GLOVE) ×3 IMPLANT
GLOVE SURG SYN 6.5 PF PI (GLOVE) ×6 IMPLANT
GOWN STRL REUS W/ TWL LRG LVL3 (GOWN DISPOSABLE) ×12 IMPLANT
KIT MARKER MARGIN INK (KITS) IMPLANT
KIT TURNOVER KIT A (KITS) ×3 IMPLANT
LABEL OR SOLS (LABEL) ×3 IMPLANT
MANIFOLD NEPTUNE II (INSTRUMENTS) ×3 IMPLANT
NDL HYPO 22X1.5 SAFETY MO (MISCELLANEOUS) ×3 IMPLANT
NDL SAFETY ECLIP 18X1.5 (MISCELLANEOUS) IMPLANT
PACK BASIN MINOR ARMC (MISCELLANEOUS) ×3 IMPLANT
RETRACTOR RING XSMALL (MISCELLANEOUS) IMPLANT
SHEATH BREAST BIOPSY SKIN MKR (SHEATH) ×3 IMPLANT
SOLN STERILE WATER 500 ML (IV SOLUTION) ×3 IMPLANT
SOLN STERILE WATER BTL 1000 ML (IV SOLUTION) ×3 IMPLANT
SUT SILK 2 0 SH (SUTURE) IMPLANT
SUT VIC AB 3-0 SH 27X BRD (SUTURE) ×3 IMPLANT
SUTURE MNCRL 4-0 27XMF (SUTURE) ×3 IMPLANT
SYR 10ML LL (SYRINGE) ×3 IMPLANT
SYR BULB IRRIG 60ML STRL (SYRINGE) ×3 IMPLANT
TRAP FLUID SMOKE EVACUATOR (MISCELLANEOUS) ×3 IMPLANT
TRAP NEPTUNE SPECIMEN COLLECT (MISCELLANEOUS) ×3 IMPLANT

## 2024-01-11 NOTE — Interval H&P Note (Signed)
 History and Physical Interval Note:  01/11/2024 11:48 AM  Caitlin Roy  has presented today for surgery, with the diagnosis of C50.112 Z17.1 malignant neoplasm of central portion of lt breast in female estrogen receptor negative.  The various methods of treatment have been discussed with the patient and family. After consideration of risks, benefits and other options for treatment, the patient has consented to  Procedure(s): BREAST LUMPECTOMY WITH RADIO FREQUENCY LOCALIZER (Left) BIOPSY, LYMPH NODE, SENTINEL (Left) as a surgical intervention.  The patient's history has been reviewed, patient examined, no change in status, stable for surgery.  I have reviewed the patient's chart and labs.  Questions were answered to the patient's satisfaction.     Lucas Sjogren

## 2024-01-11 NOTE — Transfer of Care (Signed)
 Immediate Anesthesia Transfer of Care Note  Patient: Caitlin Roy  Procedure(s) Performed: BREAST LUMPECTOMY WITH RADIO FREQUENCY LOCALIZER (Left) BIOPSY, LYMPH NODE, SENTINEL (Left: Breast)  Patient Location: PACU  Anesthesia Type:General  Level of Consciousness: awake, alert , oriented, and patient cooperative  Airway & Oxygen Therapy: Patient Spontanous Breathing  Post-op Assessment: Report given to RN and Post -op Vital signs reviewed and stable  Post vital signs: Reviewed and stable  Last Vitals:  Vitals Value Taken Time  BP 136/57 01/11/24 14:23  Temp    Pulse 77 01/11/24 14:27  Resp 25 01/11/24 14:27  SpO2 98 % 01/11/24 14:27  Vitals shown include unfiled device data.  Last Pain:  Vitals:   01/11/24 1157  TempSrc: Tympanic  PainSc: 0-No pain         Complications: No notable events documented.

## 2024-01-11 NOTE — Discharge Instructions (Addendum)

## 2024-01-11 NOTE — Op Note (Signed)
 Preoperative diagnosis: Left breast carcinoma.  Postoperative diagnosis: Same.   Procedure: Left radiofrequency tag-localized partial mastectomy.                      Left Axillary Sentinel Lymph node biopsy  Anesthesia: GETA  Surgeon: Dr. Cesar Coe  Wound Classification: Clean  Indications: Patient is a 83 y.o. female with a nonpalpable left breast mass noted on mammography with core biopsy demonstrating invasive ductal carcinoma requires radiofrequency tag-localized partial mastectomy for treatment with sentinel lymph node biopsy.   Findings: 1. Specimen mammography shows marker and tag on specimen 2. Pathology call refers gross examination of margins was close 1 mm to anterior margin 3. No other palpable mass or lymph node identified.   Description of procedure: Preoperative radiofrequency tag localization was performed by radiology. In the nuclear medicine suite, the subareolar region was injected with Tc-99 sulfur colloid. Localization studies were reviewed. The patient was taken to the operating room and placed supine on the operating table, and after general anesthesia the left chest and axilla were prepped and draped in the usual sterile fashion. A time-out was completed verifying correct patient, procedure, site, positioning, and implant(s) and/or special equipment prior to beginning this procedure.  By comparing the localization studies and interrogation with [facility] device, the probable trajectory and location of the mass was visualized. A circumareolar skin incision was planned in such a way as to minimize the amount of dissection to reach the mass.  The skin incision was made. Flaps were raised and the location of the tag was confirmed with [facility] device confirmed. A 2-0 silk figure-of-eight stay suture was placed and used for retraction. Dissection was then taken down circumferentially, taking care to include the entire localizing tag and a wide margin of grossly normal  tissue. The specimen and entire localizing tag were removed. The specimen was oriented and sent to radiology with the localization studies. Pathology reported anterior margin was close 1 mm. Re excision of anterior margin was done. The wound was irrigated. Hemostasis was checked. The wound was closed with interrupted sutures of 3-0 Vicryl and a subcuticular suture of Monocryl 3-0. No attempt was made to close the dead space.   A hand-held gamma probe was used to identify the location of the hottest spot in the axilla. An incision was made around the caudal axillary hairline. Dissection was carried down until subdermal facias was advanced. The probe was placed and again, the point of maximal count was found. Dissection continue until nodule was identified. The probe was placed in contact with the node. The node was excised in its entirety.  An additional hot spot was detected and the node was excised in similar fashion. No additional hot spots were identified. No clinically abnormal nodes were palpated. The procedure was terminated. Hemostasis was achieved and the wound closed in layers with deep interrupted 3-0 Vicryl and skin was closed with subcuticular suture of Monocryl 3-0.  The patient tolerated the procedure well and was taken to the postanesthesia care unit in stable condition.    Sentinel Node Biopsy Synoptic Operative Report  Operation performed with curative intent:Yes  Tracer(s) used to identify sentinel nodes in the upfront surgery (non-neoadjuvant) setting (select all that apply):Radioactive Tracer  Tracer(s) used to identify sentinel nodes in the neoadjuvant setting (select all that apply):N/A  All nodes (colored or non-colored) present at the end of a dye-filled lymphatic channel were removed:N/A  All significantly radioactive nodes were removed:Yes  All palpable  suspicious nodes were removed:N/A  Biopsy-proven positive nodes marked with clips prior to chemotherapy were  identified and removed:N/A  Specimen: Left Breast mass                      Re excision of anterior margin                    Sentinel Lymph nodes #1, #2  Complications: None  Estimated Blood Loss: 25 mL

## 2024-01-11 NOTE — Anesthesia Procedure Notes (Signed)
 Procedure Name: LMA Insertion Date/Time: 01/11/2024 12:19 PM  Performed by: Colon Morna SQUIBB, RNPre-anesthesia Checklist: Patient identified, Patient being monitored, Timeout performed, Emergency Drugs available and Suction available Patient Re-evaluated:Patient Re-evaluated prior to induction Oxygen Delivery Method: Circle system utilized Preoxygenation: Pre-oxygenation with 100% oxygen Induction Type: IV induction LMA: LMA inserted LMA Size: 3.0 Number of attempts: 1 Placement Confirmation: positive ETCO2 and breath sounds checked- equal and bilateral Secured at: 21 cm Tube secured with: Tape Dental Injury: Teeth and Oropharynx as per pre-operative assessment

## 2024-01-11 NOTE — Anesthesia Preprocedure Evaluation (Signed)
 Anesthesia Evaluation  Patient identified by MRN, date of birth, ID band Patient awake    Reviewed: Allergy & Precautions, H&P , NPO status , Patient's Chart, lab work & pertinent test results, reviewed documented beta blocker date and time   Airway Mallampati: II  TM Distance: >3 FB Neck ROM: full    Dental  (+) Teeth Intact   Pulmonary neg shortness of breath, pneumonia, resolved, neg recent URI, former smoker   Pulmonary exam normal        Cardiovascular Exercise Tolerance: Poor hypertension, On Medications + CAD and + CABG  Normal cardiovascular exam Rhythm:regular Rate:Normal     Neuro/Psych   Anxiety      Neuromuscular disease  negative psych ROS   GI/Hepatic Neg liver ROS, hiatal hernia,GERD  Medicated,,  Endo/Other  negative endocrine ROS    Renal/GU negative Renal ROS  negative genitourinary   Musculoskeletal   Abdominal   Peds  Hematology negative hematology ROS (+)   Anesthesia Other Findings Past Medical History: No date: Actinic keratosis No date: Anxiety     Comment:  a.) on BZO (alprazolam) PRN No date: Arthritis No date: B12 deficiency 07/15/2019: Basal cell carcinoma     Comment:  Left nasal root. Superficial, ulcerated. MOHs  01/18/2021: BCC (basal cell carcinoma)     Comment:  Right Medial Eyebrow, [facility] 02/16/21 02/13/2023: BCC (basal cell carcinoma)     Comment:  Left Lower Back, EDC 02/13/2023: BCC (basal cell carcinoma)     Comment:  Right Thigh - Anterior, ED[facility] No date: Bilateral carotid artery disease No date: BPPV (benign paroxysmal positional vertigo) No date: Cerebral microvascular disease No date: Cervical radiculopathy 06/21/2007: Coronary artery disease     Comment:  a.) LHC 06/21/2007: 80/95/75% pLAD, 20% OM1, 100% pRCA               --> CVTS consult; b.) s/p 2v CABG 07/10/2007 (LIMA-LAD,               SVG-PDA) No date: DDD (degenerative disc disease), cervical No date:  Fibrocystic disease of breast No date: GERD (gastroesophageal reflux disease) No date: Hiatal hernia 10/19/2014: Hx of basal cell carcinoma     Comment:  Left upper scapula 10/19/2014: Hx of basal cell carcinoma     Comment:  Right mid back at braline 07/24/2017: Hx of basal cell carcinoma     Comment:  Right spinal lower back 03/05/2018: Hx of basal cell carcinoma     Comment:  Left medial canthus No date: Hyperlipidemia No date: Hypertension 12/25/2023: Invasive ductal carcinoma of left breast (HCC)     Comment:  a.) Bx (+) for stage IB (cT1b, cN0, cM0, G2, ER-, PR-,               HER2-) No date: Long-term use of aspirin therapy No date: Pneumonia No date: Posterior capsular opacification 07/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     Comment:  a.) LIMA-LAD, SVG-PDA 04/25/2022: Squamous cell carcinoma of skin     Comment:  L ant thigh, EDC 08/13/2023: Squamous cell carcinoma of skin     Comment:  right hand dorsum, treated with EDC No date: Statin intolerance No date: Thrombocytosis Past Surgical History: No date: ABDOMINAL HYSTERECTOMY 10+ years ago: BREAST BIOPSY; Bilateral     Comment:  Negative 12/25/2023: BREAST BIOPSY; Left     Comment:  US  LT BREAST BX W LOC DEV 1ST LESION IMG BX SPEC US   GUIDE 12/25/2023 [facility] 01/08/2024: BREAST BIOPSY; Left     Comment:  US  LT BREAST SAVI/RF TAG 1ST LESION US  GUIDE 01/08/2024               [facility] 07/10/2007: CORONARY ARTERY BYPASS GRAFT; N/A 06/21/2007: LEFT HEART CATH AND CORONARY ANGIOGRAPHY; Left     Comment:  Procedure: LEFT HEART CATH AND CORONARY ANGIOGRAPHY;               Location: [facility]; Surgeon: Wolm Rhyme, MD BMI    Body Mass Index: 31.45 kg/m     Reproductive/Obstetrics negative OB ROS                              Anesthesia Physical Anesthesia Plan  ASA: 3  Anesthesia Plan: General LMA   Post-op Pain Management:    Induction:   PONV Risk Score and Plan: 4 or  greater  Airway Management Planned:   Additional Equipment:   Intra-op Plan:   Post-operative Plan:   Informed Consent: I have reviewed the patients History and Physical, chart, labs and discussed the procedure including the risks, benefits and alternatives for the proposed anesthesia with the patient or authorized representative who has indicated his/her understanding and acceptance.     Dental Advisory Given  Plan Discussed with: CRNA  Anesthesia Plan Comments:         Anesthesia Quick Evaluation

## 2024-01-14 ENCOUNTER — Other Ambulatory Visit: Payer: Self-pay | Admitting: Pathology

## 2024-01-14 ENCOUNTER — Encounter: Payer: Self-pay | Admitting: General Surgery

## 2024-01-14 ENCOUNTER — Ambulatory Visit

## 2024-01-14 LAB — SURGICAL PATHOLOGY

## 2024-01-16 ENCOUNTER — Inpatient Hospital Stay: Admitting: Occupational Therapy

## 2024-01-21 NOTE — Anesthesia Postprocedure Evaluation (Signed)
 Anesthesia Post Note  Patient: Caitlin Roy  Procedure(s) Performed: BREAST LUMPECTOMY WITH RADIO FREQUENCY LOCALIZER (Left) BIOPSY, LYMPH NODE, SENTINEL (Left: Breast)  Patient location during evaluation: PACU Anesthesia Type: General Level of consciousness: awake and alert Pain management: pain level controlled Vital Signs Assessment: post-procedure vital signs reviewed and stable Respiratory status: spontaneous breathing, nonlabored ventilation, respiratory function stable and patient connected to nasal cannula oxygen Cardiovascular status: blood pressure returned to baseline and stable Postop Assessment: no apparent nausea or vomiting Anesthetic complications: no   No notable events documented.   Last Vitals:  Vitals:   01/11/24 1500 01/11/24 1518  BP: (!) 144/65 (!) 153/70  Pulse: 74 75  Resp: 15 16  Temp:  36.7 C  SpO2: 96% 95%    Last Pain:  Vitals:   01/11/24 1518  TempSrc: Temporal  PainSc: 0-No pain                 Lynwood KANDICE Clause

## 2024-01-22 ENCOUNTER — Ambulatory Visit

## 2024-01-22 DIAGNOSIS — R42 Dizziness and giddiness: Secondary | ICD-10-CM

## 2024-01-22 DIAGNOSIS — R2681 Unsteadiness on feet: Secondary | ICD-10-CM

## 2024-01-22 NOTE — Therapy (Signed)
 OUTPATIENT PHYSICAL THERAPY TREATMENT Patient Name: Caitlin Roy MRN: [id number] DOB:1940/05/22, 83 y.o., female Today's Date: 01/22/2024  END OF SESSION:  PT End of Session - 01/22/24 1451     Visit Number 6    Number of Visits 16    Date for Recertification  02/07/24    Authorization Type Medicare    Authorization Time Period 12/13/23-02/07/24    Progress Note Due on Visit 10    PT Start Time 1450    PT Stop Time 1530    PT Time Calculation (min) 40 min    Equipment Utilized During Treatment Gait belt    Activity Tolerance Patient tolerated treatment well;No increased pain    Behavior During Therapy WFL for tasks assessed/performed          Past Medical History:  Diagnosis Date   Actinic keratosis    Anxiety    a.) on BZO (alprazolam) PRN   Arthritis    B12 deficiency    Basal cell carcinoma 07/15/2019   Left nasal root. Superficial, ulcerated. MOHs    BCC (basal cell carcinoma) 01/18/2021   Right Medial Eyebrow, EDC 02/16/21   BCC (basal cell carcinoma) 02/13/2023   Left Lower Back, EDC   BCC (basal cell carcinoma) 02/13/2023   Right Thigh - Anterior, EDC   Bilateral carotid artery disease    BPPV (benign paroxysmal positional vertigo)    Cerebral microvascular disease    Cervical radiculopathy    Coronary artery disease 06/21/2007   a.) LHC 06/21/2007: 80/95/75% pLAD, 20% OM1, 100% pRCA --> CVTS consult; b.) s/p 2v CABG 07/10/2007 (LIMA-LAD, SVG-PDA)   DDD (degenerative disc disease), cervical    Fibrocystic disease of breast    GERD (gastroesophageal reflux disease)    Hiatal hernia    Hx of basal cell carcinoma 10/19/2014   Left upper scapula   Hx of basal cell carcinoma 10/19/2014   Right mid back at braline   Hx of basal cell carcinoma 07/24/2017   Right spinal lower back   Hx of basal cell carcinoma 03/05/2018   Left medial canthus   Hyperlipidemia    Hypertension    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   a.) Bx (+) for stage IB  (cT1b, cN0, cM0, G2, ER-, PR-, HER2-)   Long-term use of aspirin therapy    Pneumonia    Posterior capsular opacification    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   a.) LIMA-LAD, SVG-PDA   Squamous cell carcinoma of skin 04/25/2022   L ant thigh, EDC   Squamous cell carcinoma of skin 08/13/2023   right hand dorsum, treated with EDC   Statin intolerance    Thrombocytosis    Past Surgical History:  Procedure Laterality Date   ABDOMINAL HYSTERECTOMY     BREAST BIOPSY Bilateral 10+ years ago   Negative   BREAST BIOPSY Left 12/25/2023   US  LT BREAST BX W LOC DEV 1ST LESION IMG BX SPEC US  GUIDE 12/25/2023 [facility]   BREAST BIOPSY Left 01/08/2024   US  LT BREAST SAVI/RF TAG 1ST LESION US  GUIDE 01/08/2024 [facility]   BREAST LUMPECTOMY WITH RADIO FREQUENCY LOCALIZER Left 01/11/2024   Procedure: BREAST LUMPECTOMY WITH RADIO FREQUENCY LOCALIZER;  Surgeon: Rodolph Romano, MD;  Location: [facility];  Service: General;  Laterality: Left;   CORONARY ARTERY BYPASS GRAFT N/A 07/10/2007   LEFT HEART CATH AND CORONARY ANGIOGRAPHY Left 06/21/2007   Procedure: LEFT HEART CATH AND CORONARY ANGIOGRAPHY; Location: [facility]; Surgeon: Wolm Rhyme, MD   [facility]  NODE BIOPSY Left 01/11/2024   Procedure: BIOPSY, LYMPH NODE, SENTINEL;  Surgeon: Rodolph Romano, MD;  Location: [facility];  Service: General;  Laterality: Left;   Patient Active Problem List   Diagnosis Date Noted   Invasive ductal carcinoma of left breast (HCC) 01/02/2024   PCP: Auston Reyes BIRCH, MD REFERRING PROVIDER: Auston Reyes BIRCH, MD REFERRING DIAG: Vertigo   THERAPY DIAG:  Dizziness and giddiness  Unsteadiness on feet  ONSET DATE: February 2025 with BPPV vertigo   Rationale for Evaluation and Treatment: Rehabilitation  SUBJECTIVE:   SUBJECTIVE STATEMENT: 2 weeks since last visit, procedure went well. Dizziness has been good in general. Today, things are slightly more limited, more provocation with upward head turns.    PERTINENT HISTORY:   Caitlin Roy is an 83yoF who reports illness in Feb 2025, s/p prednisone x2, then after sickness experienced insidious onset vertigo, soon made it to ENT, where they treated her 2, reported some BPPV, but indicated that she needed more vestibular rehab than they could provide. Pt has continued to have significant fluctuating disequilibrium since that time, albeit she denies any frank vertiginous symptoms, she does say there are frequent moments where she feels it coming on but it never does. Pt is limited as a historian, particularly in linear timeline of course of illness and also with relative frequency of symptoms, however she is quite colorful in her description of symptoms. She remarks on frequent fluctuations with Rt eye puffiness and Rt ear fullness feelings that relate to her symptoms. Pt says she has visual distortion on some days that precludes the ability to read text. Pt reports symptoms are almost always worse in the morning. Pt denies any falls in past 6 months, denies any close calls pertaining to this. 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   PAIN:  Are you having pain? No  PRECAUTIONS: None  WEIGHT BEARING RESTRICTIONS: No  FALLS: Has patient fallen in last 6 months? No  PATIENT GOALS: be able to fully move head for kitchen and garden tasks without disrupting her equilibrium.   OBJECTIVE:  Note: Objective measures were completed at Evaluation unless otherwise noted.  DIAGNOSTIC FINDINGS:  Pt reports a normal Head CT   PATIENT SURVEYS:  DHI: 12/18/23:    VESTIBULAR ASSESSMENT (from visit 1): Occulomotor screening:  pt has normal smooth pursuits moving Left to right, choppy Right to left, and very disrupted smooth pursuits moving up to/from down. -pt has nystagmus with sustained left gaze, left beating.  Saccade testing: -consistent  mild undershooting left and right  -vertical saccades are less organized difficult to characterize   -baseline visual acuity: Left 20/30 vision, Rt 20/75 (recommended pt follow up with eye doctor about this) (Need to repeat test for correct administration on visit 2)   Head shake test: -negative (can read same line of text)   Head Thrust Test: (+) right test, (-) left test   Test of skew: -mild lateral deviation WNL, negative test  Canal Testing: *used IR goggle video for testing  -head roll test, negative bilat -Dix hallpike test (+) Rt side only with Lt beating horizontal nystagmus.  -Pt symptomatic on Rt DixHallpike only.  TREATMENT DATE 01/22/2024 :  -review of ST goals, DHI, Rt dix hallpike, etc.  -Rt dix hall;ike Negative -Left Gufoni (no symptoms during test)  -AMB 300ft  -AMB 300ft with head turns, calling out cards -climbs 12 stairs, up, 12 stairs down, no railings, reciprocal gait pattern Unable to reporiduce pt's dizzy symptoms with dix hallpike or gufoni.   PATIENT EDUCATION: Education details: See above Person educated: [name] Education method: collaborative learning, deliberate practice, positive reinforcement, explicit instruction, establish rules. Education comprehension: Fair  HOME EXERCISE PROGRAM: Access Code: [facility] URL: [email or web address] Date: 12/18/2023 Prepared by: Peggye Linear  Exercises - Seated Gaze Stabilization with Head Nod  - 2 x daily - 7 x weekly - 3 sets - 30sec hold - Seated Gaze Stabilization with Head Nod  - 2 x daily - 7 x weekly - 3 sets - 30sec hold  GOALS: Goals reviewed with patient? No  SHORT TERM GOALS: Target date: 01/12/24  Negative canal testing to indicated resolved BPPV.  Baseline: (+) Rt dix hallpike  Goal status: MET  2.  Pt to improve score on DHI by >10 Baseline:  pending visit ; 01/22/24: 14%   Goal status: MET  3.  Pt to reports consistent performance with VOR retraining exercises at home >4d/week.  Baseline: No performing Goal status: MET   LONG TERM GOALS: Target date: 02/07/24 Pt to demonstrate ability to perform 5x60sec head nods with gaze stabilization on target at 2hz  or greater without loss of target focus and without increased symptoms.  Baseline:  Goal status: INITIAL  2.  Pt to reports >75% improve in ability to look up/down in kitchen without triggering disequilibrium.  Baseline: fearful of move head.  Goal status: INITIAL  3.  Pt to reports >75% improvement in symptoms associate with early morning bed routine and waking.  Baseline: bed in morning is worst symptom time; 01/22/24: improving  Goal status: INITIAL  ASSESSMENT:  CLINICAL IMPRESSION: Reviewed ST goals today, pt making excellent progress overall. Pt remains concerned over continued intermittent feelings of exacerbation beginnings, however has had few true exacerbation. Retest Dix Hall;ike Rt negative, achieving goal today, however we performed a Rt ear Gufoni today given the similarity in bed mobility at home that is often provoking, no symptoms today. Discussed indication for DC at this time, likely afte rnext visit if things ar estill gong well. Do not anticipate any more improvement in dizziness at this time. Patient will benefit from skilled physical therapy intervention to reduce deficits and impairments identified in evaluation, in order to reduce pain, improve quality of life, and maximize activity tolerance for ADL, IADL, and leisure/fitness. Physical therapy will help pt achieve long and short term goals of care.   OBJECTIVE IMPAIRMENTS: Decreased knowledge of condition, decreased use of DME, decreased mobility, difficulty walking, decreased strength, decreased ROM. ACTIVITY LIMITATIONS: Lifting, standing, walking, squatting, transfers, locomotion level PARTICIPATION  LIMITATIONS: Cleaning, laundry, interpersonal relationships, driving, yardwork, community activity.  PERSONAL FACTORS: Age, behavior pattern, education, past/current experiences, transportation, profession  are also affecting patient's functional outcome.  REHAB POTENTIAL: Good CLINICAL DECISION MAKING: Medium  EVALUATION COMPLEXITY: Moderate   PLAN:  PT FREQUENCY: 1-2x/week  PT DURATION: 8 weeks  PLANNED INTERVENTIONS: 97110-Therapeutic exercises, 97530- Therapeutic activity, [id number]- Neuromuscular re-education, 97535- Self Care, [id number]- Manual therapy, [phone]- Gait training, [phone]- Canalith repositioning, Patient/Family education, Balance training, Stair training, Vestibular training, Visual/preceptual remediation/compensation, and Cryotherapy  PLAN FOR NEXT SESSION:  -FU on rescheduling optometry consult -DC from PT with option to schedule back in 3  months.     2:53 PM, 01/22/2024 Peggye JAYSON Linear, PT, DPT Physical Therapist - [facility] [place]  Outpatient Physical Therapy- Main Campus [phone]     [place]wood C, PT 01/22/2024, 2:53 PM

## 2024-01-23 ENCOUNTER — Inpatient Hospital Stay: Admitting: Occupational Therapy

## 2024-01-23 ENCOUNTER — Encounter: Payer: Self-pay | Admitting: Occupational Therapy

## 2024-01-23 ENCOUNTER — Inpatient Hospital Stay: Attending: Oncology | Admitting: Hospice and Palliative Medicine

## 2024-01-23 DIAGNOSIS — Z87891 Personal history of nicotine dependence: Secondary | ICD-10-CM | POA: Insufficient documentation

## 2024-01-23 DIAGNOSIS — Z1722 Progesterone receptor negative status: Secondary | ICD-10-CM | POA: Insufficient documentation

## 2024-01-23 DIAGNOSIS — C50112 Malignant neoplasm of central portion of left female breast: Secondary | ICD-10-CM | POA: Diagnosis present

## 2024-01-23 DIAGNOSIS — L905 Scar conditions and fibrosis of skin: Secondary | ICD-10-CM | POA: Insufficient documentation

## 2024-01-23 DIAGNOSIS — C50912 Malignant neoplasm of unspecified site of left female breast: Secondary | ICD-10-CM

## 2024-01-23 DIAGNOSIS — Z808 Family history of malignant neoplasm of other organs or systems: Secondary | ICD-10-CM | POA: Diagnosis not present

## 2024-01-23 DIAGNOSIS — Z1732 Human epidermal growth factor receptor 2 negative status: Secondary | ICD-10-CM | POA: Insufficient documentation

## 2024-01-23 DIAGNOSIS — Z803 Family history of malignant neoplasm of breast: Secondary | ICD-10-CM | POA: Diagnosis not present

## 2024-01-23 DIAGNOSIS — Z171 Estrogen receptor negative status [ER-]: Secondary | ICD-10-CM | POA: Insufficient documentation

## 2024-01-23 DIAGNOSIS — Z79899 Other long term (current) drug therapy: Secondary | ICD-10-CM | POA: Diagnosis not present

## 2024-01-23 NOTE — Progress Notes (Signed)
 Multidisciplinary Oncology Council Documentation  Caitlin Roy was presented by our [facility] on 01/23/2024, which included representatives from:  Palliative Care Dietitian  Physical/Occupational Therapist Nurse Navigator Genetics Social work Survivorship RN Financial Navigator Research RN   [name] currently presents with history of breast cancer  We reviewed previous medical and familial history, history of present illness, and recent lab results along with all available histopathologic and imaging studies. The MOC considered available treatment options and made the following recommendations/referrals:  SW  The MOC is a meeting of clinicians from various specialty areas who evaluate and discuss patients for whom a multidisciplinary approach is being considered. Final determinations in the plan of care are those of the provider(s).   Today's extended care, comprehensive team conference, Caitlin Roy was not present for the discussion and was not examined.

## 2024-01-23 NOTE — Therapy (Signed)
 OUTPATIENT OCCUPATIONAL THERAPY BREAST CANCER POSTOP EVALUATION   Patient Name: Caitlin Roy MRN: [id number] DOB:[birth year], 83 y.o., female Today's Date: 01/23/2024  END OF SESSION:  OT End of Session - 01/23/24 1018     Visit Number 1    Number of Visits 2    Date for Recertification  03/19/24    OT Start Time 0950    OT Stop Time 1018    OT Time Calculation (min) 28 min    Activity Tolerance Patient tolerated treatment well    Behavior During Therapy [facility] for tasks assessed/performed          Past Medical History:  Diagnosis Date   Actinic keratosis    Anxiety    a.) on BZO (alprazolam) PRN   Arthritis    B12 deficiency    Basal cell carcinoma 07/15/2019   Left nasal root. Superficial, ulcerated. MOHs    BCC (basal cell carcinoma) 01/18/2021   Right Medial Eyebrow, EDC 02/16/21   BCC (basal cell carcinoma) 02/13/2023   Left Lower Back, EDC   BCC (basal cell carcinoma) 02/13/2023   Right Thigh - Anterior, EDC   Bilateral carotid artery disease    BPPV (benign paroxysmal positional vertigo)    Cerebral microvascular disease    Cervical radiculopathy    Coronary artery disease 06/21/2007   a.) LHC 06/21/2007: 80/95/75% pLAD, 20% OM1, 100% pRCA --> CVTS consult; b.) s/p 2v CABG 07/10/2007 (LIMA-LAD, SVG-PDA)   DDD (degenerative disc disease), cervical    Fibrocystic disease of breast    GERD (gastroesophageal reflux disease)    Hiatal hernia    Hx of basal cell carcinoma 10/19/2014   Left upper scapula   Hx of basal cell carcinoma 10/19/2014   Right mid back at braline   Hx of basal cell carcinoma 07/24/2017   Right spinal lower back   Hx of basal cell carcinoma 03/05/2018   Left medial canthus   Hyperlipidemia    Hypertension    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   a.) Bx (+) for stage IB (cT1b, cN0, cM0, G2, ER-, PR-, HER2-)   Long-term use of aspirin therapy    Pneumonia    Posterior capsular opacification    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   a.) LIMA-LAD, SVG-PDA   Squamous cell carcinoma of skin 04/25/2022   L ant thigh, EDC   Squamous cell carcinoma of skin 08/13/2023   right hand dorsum, treated with EDC   Statin intolerance    Thrombocytosis    Past Surgical History:  Procedure Laterality Date   ABDOMINAL HYSTERECTOMY     BREAST BIOPSY Bilateral 10+ years ago   Negative   BREAST BIOPSY Left 12/25/2023   US  LT BREAST BX W LOC DEV 1ST LESION IMG BX SPEC US  GUIDE 12/25/2023 [facility]   BREAST BIOPSY Left 01/08/2024   US  LT BREAST SAVI/RF TAG 1ST LESION US  GUIDE 01/08/2024 [facility]   BREAST LUMPECTOMY WITH RADIO FREQUENCY LOCALIZER Left 01/11/2024   Procedure: BREAST LUMPECTOMY WITH RADIO FREQUENCY LOCALIZER;  Surgeon: Rodolph Romano, MD;  Location: [facility];  Service: General;  Laterality: Left;   CORONARY ARTERY BYPASS GRAFT N/A 07/10/2007   LEFT HEART CATH AND CORONARY ANGIOGRAPHY Left 06/21/2007   Procedure: LEFT HEART CATH AND CORONARY ANGIOGRAPHY; Location: [facility]; Surgeon: Wolm Rhyme, MD   SENTINEL NODE BIOPSY Left 01/11/2024   Procedure: BIOPSY, LYMPH NODE, SENTINEL;  Surgeon: Rodolph Romano, MD;  Location: [facility];  Service: General;  Laterality: Left;  Patient Active Problem List   Diagnosis Date Noted   Invasive ductal carcinoma of left breast (HCC) 01/02/2024    PCP: Dr Auston MART PROVIDER: Dr Calista  REFERRING DIAG: L lumpectomy  THERAPY DIAG:  Scar condition and fibrosis of skin  Rationale for Evaluation and Treatment: Rehabilitation  ONSET DATE: 01/11/24  SUBJECTIVE:                                                                                                                                                                                           SUBJECTIVE STATEMENT: Patient reports she is here today after being refer by one of her medical team after her L lumpectomy by Dr Xenia on 01/11/24  PERTINENT HISTORY:  Patient had a  left lumpectomy by Dr. Rodolph on 01/11/2024.  Patient next follow-up is 01/25/2011 for Dr. Jacobo and Dr. Cesar.?  Radiation  PATIENT GOALS:   reduce lymphedema risk and learn post op HEP.   PAIN:  Are you having pain?  Denies pain  PRECAUTIONS: Active CA , left upper extremity lymphedema risk   HAND DOMINANCE: right  WEIGHT BEARING RESTRICTIONS: No  FALLS:  Has patient fallen in last 6 months? No  LIVING ENVIRONMENT: Patient lives with: Alone her son lives about 500 yards from her  OCCUPATION and LEISURE: Patient is retired.  Likes to work in the yard, proofreader.   OBJECTIVE:  COGNITION: Overall cognitive status: Within functional limits for tasks assessed    POSTURE:  Forward head and rounded shoulders posture  UPPER EXTREMITY AROM/PROM:  Bilateral shoulder active range of motion within normal range.  Left flexion abduction external rotation within normal range with slight pull in axilla but less than 1/10 CERVICAL AROM: All within normal limits:     UPPER EXTREMITY STRENGTH: 4+/5   LYMPHEDEMA ASSESSMENTS:    L-DEX LYMPHEDEMA SCREENING:  The patient was assessed using the L-Dex machine today to produce a lymphedema index baseline score. The patient will be reassessed on a regular basis (typically every 3 months) to obtain new L-Dex scores. If the score is > 6.5 points away from his/her baseline score indicating onset of subclinical lymphedema, it will be recommended to wear a compression garment for 4 weeks, 12 hours per day and then be reassessed. If the score continues to be > 6.5 points from baseline at reassessment, we will initiate lymphedema treatment. Assessing in this manner has a 95% rate of preventing clinically significant lymphedema.   L-DEX FLOWSHEETS - 01/23/24 1000       L-DEX LYMPHEDEMA SCREENING   Measurement Type Unilateral    L-DEX MEASUREMENT EXTREMITY Upper Extremity  POSITION  Standing    DOMINANT SIDE  Right    At Risk Side Left    BASELINE SCORE (UNILATERAL) 1.1            PATIENT EDUCATION:  Education details: Lymphedema risk reduction and precautions as well as signs and symptoms. Person educated: Patient Education method: Explanation, Demonstration, Handout Education comprehension: Patient verbalized understanding and returned demonstration  HOME EXERCISE PROGRAM: Patient can initiate some scar massage in about a week or 2.  Upon assessment of the scar in axilla patient has 1/2 cm of gapping of scar.  But closed.  Patient has appointment with surgeon on Friday.  Reinforced with patient not to overstretch endrange. Patient is active range of motion within normal range and functional use -reviewed with patient lymphedema signs and symptoms as well as prevention and proportions.  And handout was reviewed with her and provided that was in the cancer journal. ASSESSMENT:  CLINICAL IMPRESSION: Patient present at OT evaluation.  Left lumpectomy on 01/11/2024 by Dr. Rodolph.  Patient with great active range of motion within normal range.  Patient with 1/2 cm of gapping at incision in the axilla.  Remind patient not to overstretch but let it heal.  As well as to hold off on any scar massage until about a week or 2.  Patient has appointment with surgeon on Friday.  Patient was educated on signs and symptoms as well as prevention precautions for lymphedema.  And handout provided.  Her L-Dex score was within normal range.  Patient can benefit from continued L-Dex screens every 3 months for 2 years to detect subclinical lymphedema.  Breast navigator will contact patient for follow-up.  Pt will benefit from skilled therapeutic intervention to improve on the following deficits: Decreased knowledge of precautions and lymphedema education, impaired UE functional use, pain, decreased ROM, postural dysfunction.   OT treatment/interventions: ADL/self-care home management, pt/family education,  therapeutic exercise,manual therapy  REHAB POTENTIAL: Good  CLINICAL DECISION MAKING: Stable/uncomplicated  EVALUATION COMPLEXITY: Low   GOALS: Goals reviewed with patient? YES  LONG TERM GOALS: (STG=LTG)    Name Target Date Goal status  1 Pt will be able to verbalize understanding of pertinent lymphedema risk reduction practices relevant to her dx specifically related to skin care.  Baseline:  No knowledge Today Achieved at eval  2 Pt will be able to return demo and/or verbalize understanding of the post op HEP related to regaining shoulder ROM. Baseline:  No knowledge Today Achieved at eval       4 Pt will demo she has regained full shoulder ROM and function post operatively compared to baselines.  Baseline: See objective measurements taken today. Today Achieved    PLAN:  OT FREQUENCY/DURATION: EVAL and 1 follow up appointment if needed  PLAN FOR NEXT SESSION:Occupational Therapy Information for After Breast Cancer Surgery/Treatment:  Lymphedema is a swelling condition that you may be at risk for in your arm if you have lymph nodes removed from the armpit area.  After a sentinel node biopsy, the risk is approximately 5-9% and is higher after an axillary node dissection.  There is treatment available for this condition and it is not life-threatening.  Contact your physician or occupational therapist with concerns. You may begin the 4 shoulder/posture exercises (see additional sheet) when permitted by your physician (typically a week after surgery).  If you have drains, you may need to wait until those are removed before beginning range of motion exercises.  A general recommendation is to not lift  your arms above shoulder height until drains are removed.  These exercises should be done to your tolerance and gently.  This is not a no pain/no gain type of recovery so listen to your body and stretch into the range of motion that you can tolerate, stopping if you have pain.  If you are  having immediate reconstruction, ask your plastic surgeon about doing exercises as he or she may want you to wait. .  While undergoing any medical procedure or treatment, try to avoid blood pressure being taken or needle sticks from occurring on the arm on the side of cancer.   This recommendation begins after surgery and continues for the rest of your life.  This may help reduce your risk of getting lymphedema (swelling in your arm). An excellent resource for those seeking information on lymphedema is the National Lymphedema Network's web site. It can be accessed at www.lymphnet.org If you notice swelling in your hand, arm or breast at any time following surgery (even if it is many years from now), please contact your doctor or occupational therapist to discuss this.  Lymphedema can be treated at any time but it is easier for you if it is treated early on.  If you feel like your shoulder motion is not returning to normal in a reasonable amount of time, please contact your surgeon or occupational therapist.  [facility] [phone]. [address], [place], [place] [address]     Ancel Peters, OTR/L,CLT 01/23/2024, 10:19 AM

## 2024-01-24 ENCOUNTER — Inpatient Hospital Stay: Attending: Oncology

## 2024-01-24 NOTE — Progress Notes (Signed)
 [facility] Clinical Social Work  Clinical Social Work was referred by [facility] / NP for assessment of psychosocial needs.  Clinical Social Work Intern contacted patient by phone to offer support and assess for needs.  Patient reports she is doing well since surgery, and is eager to speak with oncology providers tomorrow to discuss next steps of treatment. Patient denies any material or emotional needs at this time.  Interventions: Discussed common feeling and emotions when being diagnosed with cancer, and the importance of support during treatment Informed patient of the support team roles and support services at [facility] Provided CSW contact information and encouraged patient to call with any questions or concerns      Follow Up Plan:  Patient will contact CSW with any support or resource needs    Caitlin Roy  Clinical Social Work Intern [facility]

## 2024-01-25 ENCOUNTER — Inpatient Hospital Stay (HOSPITAL_BASED_OUTPATIENT_CLINIC_OR_DEPARTMENT_OTHER): Admitting: Oncology

## 2024-01-25 ENCOUNTER — Encounter: Payer: Self-pay | Admitting: Oncology

## 2024-01-25 VITALS — BP 146/70 | HR 74 | Temp 97.3°F | Resp 18 | Ht 62.0 in | Wt 142.0 lb

## 2024-01-25 DIAGNOSIS — C50912 Malignant neoplasm of unspecified site of left female breast: Secondary | ICD-10-CM | POA: Diagnosis not present

## 2024-01-25 DIAGNOSIS — C50112 Malignant neoplasm of central portion of left female breast: Secondary | ICD-10-CM | POA: Diagnosis not present

## 2024-01-25 NOTE — Progress Notes (Signed)
 Patient is doing well

## 2024-01-25 NOTE — Progress Notes (Signed)
 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  INTERVAL HISTORY: Patient returns to clinic today after undergoing lumpectomy on January 11, 2024 for further evaluation and treatment planning.  She tolerated her surgery well without significant side effects.  She currently feels well and is asymptomatic.  She has no neurologic complaints.  She denies any recent fevers or illnesses.  She has a good appetite and denies weight loss.  She has no chest pain, shortness of breath, cough, or hemoptysis.  She denies any nausea, vomiting, constipation, or diarrhea.  She has no urinary complaints.  Patient offers no specific complaints today.  REVIEW OF SYSTEMS:   Review of Systems  Constitutional: Negative.  Negative for fever, malaise/fatigue and weight loss.  Respiratory: Negative.  Negative for cough, hemoptysis and shortness of breath.   Cardiovascular: Negative.  Negative for chest pain and leg swelling.  Gastrointestinal: Negative.  Negative for abdominal pain.  Genitourinary: Negative.  Negative for dysuria.  Musculoskeletal: Negative.  Negative for back pain.  Skin: Negative.  Negative for rash.  Neurological: Negative.  Negative for dizziness, focal weakness, weakness and headaches.  Psychiatric/Behavioral: Negative.  The patient is not nervous/anxious.     As per HPI. Otherwise, a complete review of systems is negative.  PAST MEDICAL HISTORY: Past Medical History:  Diagnosis Date   Actinic keratosis    Anxiety    a.) on BZO (alprazolam) PRN   Arthritis    B12 deficiency    Basal cell carcinoma  07/15/2019   Left nasal root. Superficial, ulcerated. MOHs    BCC (basal cell carcinoma) 01/18/2021   Right Medial Eyebrow, EDC 02/16/21   BCC (basal cell carcinoma) 02/13/2023   Left Lower Back, EDC   BCC (basal cell carcinoma) 02/13/2023   Right Thigh - Anterior, EDC   Bilateral carotid artery disease    BPPV (benign paroxysmal positional vertigo)    Cerebral microvascular disease    Cervical radiculopathy    Coronary artery disease 06/21/2007   a.) LHC 06/21/2007: 80/95/75% pLAD, 20% OM1, 100% pRCA --> CVTS consult; b.) s/p 2v CABG 07/10/2007 (LIMA-LAD, SVG-PDA)   DDD (degenerative disc disease), cervical    Fibrocystic disease of breast    GERD (gastroesophageal reflux disease)    Hiatal hernia    Hx of basal cell carcinoma 10/19/2014   Left upper scapula   Hx of basal cell carcinoma 10/19/2014   Right mid back at braline   Hx of basal cell carcinoma 07/24/2017   Right spinal lower back   Hx of basal cell carcinoma 03/05/2018   Left medial canthus   Hyperlipidemia    Hypertension    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   a.) Bx (+) for stage IB (cT1b, cN0, cM0, G2, ER-, PR-, HER2-)   Long-term use of aspirin therapy    Pneumonia    Posterior capsular opacification    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   a.) LIMA-LAD, SVG-PDA   Squamous cell carcinoma of skin 04/25/2022   L ant thigh, EDC   Squamous cell carcinoma of skin 08/13/2023   right hand dorsum, treated with EDC   Statin intolerance  Thrombocytosis     PAST SURGICAL HISTORY: Past Surgical History:  Procedure Laterality Date   ABDOMINAL HYSTERECTOMY     BREAST BIOPSY Bilateral 10+ years ago   Negative   BREAST BIOPSY Left 12/25/2023   US  LT BREAST BX W LOC DEV 1ST LESION IMG BX SPEC US  GUIDE 12/25/2023 [facility]   BREAST BIOPSY Left 01/08/2024   US  LT BREAST SAVI/RF TAG 1ST LESION US  GUIDE 01/08/2024 [facility]   BREAST LUMPECTOMY WITH RADIO FREQUENCY LOCALIZER Left 01/11/2024    Procedure: BREAST LUMPECTOMY WITH RADIO FREQUENCY LOCALIZER;  Surgeon: Rodolph Romano, MD;  Location: [facility];  Service: General;  Laterality: Left;   CORONARY ARTERY BYPASS GRAFT N/A 07/10/2007   LEFT HEART CATH AND CORONARY ANGIOGRAPHY Left 06/21/2007   Procedure: LEFT HEART CATH AND CORONARY ANGIOGRAPHY; Location: [facility]; Surgeon: Wolm Rhyme, MD   SENTINEL NODE BIOPSY Left 01/11/2024   Procedure: BIOPSY, LYMPH NODE, SENTINEL;  Surgeon: Rodolph Romano, MD;  Location: [facility];  Service: General;  Laterality: Left;    FAMILY HISTORY: Family History  Problem Relation Age of Onset   Breast cancer Maternal Aunt 60   Melanoma Brother     ADVANCED DIRECTIVES (Y/N):  N  HEALTH MAINTENANCE: Social History   Tobacco Use   Smoking status: Former   Smokeless tobacco: Never  Vaping Use   Vaping status: Never Used  Substance Use Topics   Alcohol use: No   Drug use: No     Colonoscopy:  PAP:  Bone density:  Lipid panel:  Allergies  Allergen Reactions   Statins     Leg cramps   Amitriptyline Rash    Current Outpatient Medications  Medication Sig Dispense Refill   ALPRAZolam (XANAX) 0.5 MG tablet Take 0.25-0.5 mg by mouth 2 (two) times daily as needed for anxiety.     aspirin 81 MG chewable tablet Chew 81 mg by mouth daily.     ezetimibe (ZETIA) 10 MG tablet Take 10 mg by mouth daily.     hydrochlorothiazide (HYDRODIURIL) 25 MG tablet Take 12.5 mg by mouth daily.     traMADol  (ULTRAM ) 50 MG tablet Take 1 tablet (50 mg total) by mouth every 6 (six) hours as needed. (Patient not taking: Reported on 01/25/2024) 10 tablet 0   No current facility-administered medications for this visit.    OBJECTIVE: Vitals:   01/25/24 1125  BP: (!) 146/70  Pulse: 74  Resp: 18  Temp: (!) 97.3 F (36.3 C)  SpO2: 95%     Body mass index is 25.97 kg/m.    ECOG FS:0 - Asymptomatic  General: Well-developed, well-nourished, no acute distress. Eyes: Pink conjunctiva, anicteric  sclera. HEENT: Normocephalic, moist mucous membranes. Lungs: No audible wheezing or coughing. Heart: Regular rate and rhythm. Abdomen: Soft, nontender, no obvious distention. Musculoskeletal: No edema, cyanosis, or clubbing. Neuro: Alert, answering all questions appropriately. Cranial nerves grossly intact. Skin: No rashes or petechiae noted. Psych: Normal affect.  LAB RESULTS:  No results found for: NA, K, CL, CO2, GLUCOSE, BUN, CREATININE, CALCIUM, PROT, ALBUMIN, AST, ALT, ALKPHOS, BILITOT, GFRNONAA, GFRAA  No results found for: WBC, NEUTROABS, HGB, HCT, MCV, PLT   STUDIES: MM Breast Surgical Specimen Result Date: 01/11/2024 CLINICAL DATA:  Status post Savi Scout localized LEFT breast lumpectomy. EXAM: SPECIMEN RADIOGRAPH OF THE LEFT BREAST COMPARISON:  Previous exam(s). FINDINGS: Status post excision of the LEFT breast. The [facility] reflector and Venus shaped clip are present within the specimen. IMPRESSION: Specimen radiograph of the LEFT breast. Electronically Signed  By: Norleen Croak M.D.   On: 01/11/2024 13:20   NM Sentinel Node Inj-No Rpt (Breast) Result Date: 01/11/2024 Lymphoseek was injected by the Nuclear Medicine Technologist for sentinel lymph node localization.   MM 3D DIAGNOSTIC MAMMOGRAM UNILATERAL LEFT BREAST Result Date: 01/08/2024 CLINICAL DATA:  Status post LEFT breast SAVI scout placement prior to lumpectomy planned on 01/11/2024 EXAM: DIGITAL DIAGNOSTIC UNILATERAL LEFT MAMMOGRAM WITH TOMOSYNTHESIS AND CAD TECHNIQUE: Left digital diagnostic mammography and breast tomosynthesis was performed. The images were evaluated with computer-aided detection. COMPARISON:  Previous exam(s). ACR Breast Density Category b: There are scattered areas of fibroglandular density. FINDINGS: Appropriate positioning of the SAVI scout marker within the LEFT breast mass containing Venus biopsy marking clip which corresponds with known invasive  ductal carcinoma. The images were marked for the patient's surgeon. There are no new suspicious findings in the LEFT breast. IMPRESSION: Successful SAVI scout placement in the LEFT breast prior to lumpectomy, with appropriate positioning of the SAVI scout marker within the biopsied mass. The images were marked for the patient's surgeon. RECOMMENDATION: Treatment plan per surgical/oncology teams. I have discussed the findings and recommendations with the patient. If applicable, a reminder letter will be sent to the patient regarding the next appointment. BI-RADS CATEGORY  6: Known biopsy-proven malignancy. Electronically Signed   By: Norleen Croak M.D.   On: 01/08/2024 16:37   US  LT BREAST SAVI/RF TAG 1ST LESION US  GUIDE Result Date: 01/08/2024 CLINICAL DATA:  LEFT breast cancer with lumpectomy planned on 01/11/2024 EXAM: NEEDLE LOCALIZATION OF THE LEFT BREAST WITH ULTRASOUND GUIDANCE COMPARISON:  Previous exam(s). FINDINGS: Patient presents for needle localization prior to LEFT breast lumpectomy. I met with the patient and we discussed the procedure of needle localization including benefits and alternatives. We discussed the high likelihood of a successful procedure. We discussed the risks of the procedure, including infection, bleeding, tissue injury, and further surgery. Informed, written consent was given. The usual time-out protocol was performed immediately prior to the procedure. Using ultrasound guidance, sterile technique, 1% lidocaine  and a 10 cm SAVI SCOUT needle, the LEFT breast mass at 12 o'clock 9 cm from the nipple was localized using a anti radial approach. The images were marked for Dr. Rodolph. IMPRESSION: Radar reflector localization of the LEFT breast. No apparent complications. Electronically Signed   By: Norleen Croak M.D.   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  Typically a patient with triple negative breast cancer would receive adjuvant chemotherapy, but given the small size of her malignancy and advanced age it was agreed upon that this is not recommended.  She will however benefit from adjuvant XRT and a referral was sent to radiation oncology.  No further intervention is needed.  Patient will not benefit from an aromatase inhibitor.  Return to clinic at the conclusion of XRT for further evaluation.   I spent a total of 30 minutes reviewing chart data, face-to-face evaluation with the patient, counseling and coordination of care as detailed above.   Patient expressed understanding and was in agreement with this plan. She also understands that She can call clinic at any time with any questions, concerns, or complaints.    Cancer Staging  Invasive ductal carcinoma of left breast [facility]) Staging form: Breast,  AJCC 8th Edition - Clinical stage from 01/02/2024: Stage IB (cT1b, cN0, cM0, G2, ER-, PR-, HER2-) - Signed by Jacobo Evalene PARAS, MD on 01/02/2024 Stage prefix: Initial diagnosis Mitotic count score: Score 2 Histologic grading system: 3 grade system   Evalene PARAS Jacobo, MD   01/25/2024 11:37 AM     "

## 2024-01-28 ENCOUNTER — Inpatient Hospital Stay

## 2024-01-29 ENCOUNTER — Ambulatory Visit

## 2024-01-29 DIAGNOSIS — R2681 Unsteadiness on feet: Secondary | ICD-10-CM

## 2024-01-29 DIAGNOSIS — R42 Dizziness and giddiness: Secondary | ICD-10-CM | POA: Diagnosis not present

## 2024-01-29 NOTE — Therapy (Signed)
 " OUTPATIENT PHYSICAL THERAPY TREATMENT/DISCHARGE Patient Name: Caitlin Roy MRN: [id number] DOB:11-16-1940, 83 y.o., female Today's Date: 01/29/2024  END OF SESSION:  PT End of Session - 01/29/24 1602     Visit Number 7    Number of Visits 16    Date for Recertification  02/07/24    Authorization Type Medicare    Authorization Time Period 12/13/23-02/07/24    Progress Note Due on Visit 10    PT Start Time 1530    PT Stop Time 1610    PT Time Calculation (min) 40 min    Equipment Utilized During Treatment Gait belt    Activity Tolerance Patient tolerated treatment well;No increased pain    Behavior During Therapy WFL for tasks assessed/performed          Past Medical History:  Diagnosis Date   Actinic keratosis    Anxiety    a.) on BZO (alprazolam) PRN   Arthritis    B12 deficiency    Basal cell carcinoma 07/15/2019   Left nasal root. Superficial, ulcerated. MOHs    BCC (basal cell carcinoma) 01/18/2021   Right Medial Eyebrow, EDC 02/16/21   BCC (basal cell carcinoma) 02/13/2023   Left Lower Back, EDC   BCC (basal cell carcinoma) 02/13/2023   Right Thigh - Anterior, EDC   Bilateral carotid artery disease    BPPV (benign paroxysmal positional vertigo)    Cerebral microvascular disease    Cervical radiculopathy    Coronary artery disease 06/21/2007   a.) LHC 06/21/2007: 80/95/75% pLAD, 20% OM1, 100% pRCA --> CVTS consult; b.) s/p 2v CABG 07/10/2007 (LIMA-LAD, SVG-PDA)   DDD (degenerative disc disease), cervical    Fibrocystic disease of breast    GERD (gastroesophageal reflux disease)    Hiatal hernia    Hx of basal cell carcinoma 10/19/2014   Left upper scapula   Hx of basal cell carcinoma 10/19/2014   Right mid back at braline   Hx of basal cell carcinoma 07/24/2017   Right spinal lower back   Hx of basal cell carcinoma 03/05/2018   Left medial canthus   Hyperlipidemia    Hypertension    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   a.) Bx (+) for  stage IB (cT1b, cN0, cM0, G2, ER-, PR-, HER2-)   Long-term use of aspirin therapy    Pneumonia    Posterior capsular opacification    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   a.) LIMA-LAD, SVG-PDA   Squamous cell carcinoma of skin 04/25/2022   L ant thigh, EDC   Squamous cell carcinoma of skin 08/13/2023   right hand dorsum, treated with EDC   Statin intolerance    Thrombocytosis    Past Surgical History:  Procedure Laterality Date   ABDOMINAL HYSTERECTOMY     BREAST BIOPSY Bilateral 10+ years ago   Negative   BREAST BIOPSY Left 12/25/2023   US  LT BREAST BX W LOC DEV 1ST LESION IMG BX SPEC US  GUIDE 12/25/2023 [facility]   BREAST BIOPSY Left 01/08/2024   US  LT BREAST SAVI/RF TAG 1ST LESION US  GUIDE 01/08/2024 [facility]   BREAST LUMPECTOMY WITH RADIO FREQUENCY LOCALIZER Left 01/11/2024   Procedure: BREAST LUMPECTOMY WITH RADIO FREQUENCY LOCALIZER;  Surgeon: Rodolph Romano, MD;  Location: [facility];  Service: General;  Laterality: Left;   CORONARY ARTERY BYPASS GRAFT N/A 07/10/2007   LEFT HEART CATH AND CORONARY ANGIOGRAPHY Left 06/21/2007   Procedure: LEFT HEART CATH AND CORONARY ANGIOGRAPHY; Location: [facility]; Surgeon: Wolm Rhyme, MD  SENTINEL NODE BIOPSY Left 01/11/2024   Procedure: BIOPSY, LYMPH NODE, SENTINEL;  Surgeon: Rodolph Romano, MD;  Location: [facility];  Service: General;  Laterality: Left;   Patient Active Problem List   Diagnosis Date Noted   Invasive ductal carcinoma of left breast (HCC) 01/02/2024   PCP: Auston Reyes BIRCH, MD REFERRING PROVIDER: Auston Reyes BIRCH, MD REFERRING DIAG: Vertigo   THERAPY DIAG:  Dizziness and giddiness  Unsteadiness on feet  ONSET DATE: February 2025 with BPPV vertigo   Rationale for Evaluation and Treatment: Rehabilitation  SUBJECTIVE:   SUBJECTIVE STATEMENT: 2 weeks since last visit, procedure went well. Dizziness has been good in general. Today, things are slightly more limited, more provocation with upward head  turns.   PERTINENT HISTORY:   Caitlin Roy is an 83yoF who reports illness in Feb 2025, s/p prednisone x2, then after sickness experienced insidious onset vertigo, soon made it to ENT, where they treated her 2, reported some BPPV, but indicated that she needed more vestibular rehab than they could provide. Pt has continued to have significant fluctuating disequilibrium since that time, albeit she denies any frank vertiginous symptoms, she does say there are frequent moments where she feels it coming on but it never does. Pt is limited as a historian, particularly in linear timeline of course of illness and also with relative frequency of symptoms, however she is quite colorful in her description of symptoms. She remarks on frequent fluctuations with Rt eye puffiness and Rt ear fullness feelings that relate to her symptoms. Pt says she has visual distortion on some days that precludes the ability to read text. Pt reports symptoms are almost always worse in the morning. Pt denies any falls in past 6 months, denies any close calls pertaining to this. 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   PAIN:  Are you having pain? No  PRECAUTIONS: None  WEIGHT BEARING RESTRICTIONS: No  FALLS: Has patient fallen in last 6 months? No  PATIENT GOALS: be able to fully move head for kitchen and garden tasks without disrupting her equilibrium.   OBJECTIVE:  Note: Objective measures were completed at Evaluation unless otherwise noted.  DIAGNOSTIC FINDINGS:  Pt reports a normal Head CT   PATIENT SURVEYS:  DHI: 12/18/23:    VESTIBULAR ASSESSMENT (from visit 1): Occulomotor screening:  pt has normal smooth pursuits moving Left to right, choppy Right to left, and very disrupted smooth pursuits moving up to/from down. -pt has nystagmus with sustained left gaze, left beating.  Saccade  testing: -consistent mild undershooting left and right  -vertical saccades are less organized difficult to characterize   -baseline visual acuity: Left 20/30 vision, Rt 20/75 (recommended pt follow up with eye doctor about this) (Need to repeat test for correct administration on visit 2)   Head shake test: -negative (can read same line of text)   Head Thrust Test: (+) right test, (-) left test   Test of skew: -mild lateral deviation WNL, negative test  Canal Testing: *used IR goggle video for testing  -head roll test, negative bilat -Dix hallpike test (+) Rt side only with Lt beating horizontal nystagmus.  -Pt symptomatic on Rt DixHallpike only.  TREATMENT DATE 01/29/2024 :  -Reviwed LT goals -gaze stabilization with horizontal head turns 2x60sec -gaze stabilization with vertical head turns 2x60sec -VOR cancelation 2x60sec veti, 2x60wecx hor   PATIENT EDUCATION: Education details: See above Person educated: [name] Education method: collaborative learning, deliberate practice, positive reinforcement, explicit instruction, establish rules. Education comprehension: Fair  HOME EXERCISE PROGRAM: Access Code: [facility] URL: [email or web address] Date: 12/18/2023 Prepared by: Peggye Linear  Exercises - Seated Gaze Stabilization with Head Nod  - 2 x daily - 7 x weekly - 3 sets - 30sec hold - Seated Gaze Stabilization with Head Nod  - 2 x daily - 7 x weekly - 3 sets - 30sec hold  GOALS: Goals reviewed with patient? YES  SHORT TERM GOALS: Target date: 01/12/24  Negative canal testing to indicated resolved BPPV.  Baseline: (+) Rt dix hallpike  Goal status: MET  2.  Pt to improve score on DHI by >10 Baseline: pending visit ; 01/22/24: 14%   Goal status: MET  3.  Pt to reports consistent performance with VOR retraining exercises at home  >4d/week.  Baseline: No performing Goal status: MET   LONG TERM GOALS: Target date: 02/07/24 Pt to demonstrate ability to perform 5x60sec head nods with gaze stabilization on target at 2hz  or greater without loss of target focus and without increased symptoms.  Baseline:  Goal status: ACHIEVED   2.  Pt to reports >75% improve in ability to look up/down in kitchen without triggering disequilibrium.  Baseline: fearful of move head; 01/29/24: able to look up and down without consistent exacerbation most of the time. Pt reports 70% improved.  Goal status: ACHIEVED;   3.  Pt to reports >75% improvement in symptoms associate with early morning bed routine and waking.  Baseline: bed in morning is worst symptom time; 01/22/24: improving; 01/29/24: no dizzy symptoms first thing in the morning.  Goal status: ACHIEVED  ASSESSMENT:  CLINICAL IMPRESSION: Expanded visual training exercises in session today, good oculomotor control overall. Pt has no exacerbation of symptoms in session. Pt feels ready for DC today to independent program. Patient will benefit from skilled physical therapy intervention to reduce deficits and impairments identified in evaluation, in order to reduce pain, improve quality of life, and maximize activity tolerance for ADL, IADL, and leisure/fitness. Physical therapy will help pt achieve long and short term goals of care.   OBJECTIVE IMPAIRMENTS: Decreased knowledge of condition, decreased use of DME, decreased mobility, difficulty walking, decreased strength, decreased ROM. ACTIVITY LIMITATIONS: Lifting, standing, walking, squatting, transfers, locomotion level PARTICIPATION LIMITATIONS: Cleaning, laundry, interpersonal relationships, driving, yardwork, community activity.  PERSONAL FACTORS: Age, behavior pattern, education, past/current experiences, transportation, profession  are also affecting patient's functional outcome.  REHAB POTENTIAL: Good CLINICAL DECISION MAKING:  Medium  EVALUATION COMPLEXITY: Moderate   PLAN:  PT FREQUENCY: 1-2x/week  PT DURATION: 8 weeks  PLANNED INTERVENTIONS: 97110-Therapeutic exercises, 97530- Therapeutic activity, [id number]- Neuromuscular re-education, 97535- Self Care, [id number]- Manual therapy, [phone]- Gait training, [phone]- Canalith repositioning, Patient/Family education, Balance training, Stair training, Vestibular training, Visual/preceptual remediation/compensation, and Cryotherapy  PLAN FOR NEXT SESSION:  -FU on rescheduling optometry consult -DC from PT with option to schedule back in 3 months.     4:04 PM, 01/29/2024 Peggye JAYSON Linear, PT, DPT Physical Therapist - [facility]  Outpatient Physical Therapy- Main Campus [phone]     [place], PT 01/29/2024, 4:04 PM  "

## 2024-02-04 ENCOUNTER — Ambulatory Visit
Admission: RE | Admit: 2024-02-04 | Discharge: 2024-02-04 | Disposition: A | Discharge status: Home / Self Care | Source: Ambulatory Visit | Attending: Radiation Oncology | Admitting: Radiation Oncology

## 2024-02-04 ENCOUNTER — Encounter: Payer: Self-pay | Admitting: Radiation Oncology

## 2024-02-04 ENCOUNTER — Ambulatory Visit

## 2024-02-04 VITALS — BP 139/77 | HR 69 | Temp 97.5°F | Resp 12 | Wt 143.0 lb

## 2024-02-04 DIAGNOSIS — M1990 Unspecified osteoarthritis, unspecified site: Secondary | ICD-10-CM | POA: Insufficient documentation

## 2024-02-04 DIAGNOSIS — I10 Essential (primary) hypertension: Secondary | ICD-10-CM | POA: Insufficient documentation

## 2024-02-04 DIAGNOSIS — N6011 Diffuse cystic mastopathy of right breast: Secondary | ICD-10-CM | POA: Insufficient documentation

## 2024-02-04 DIAGNOSIS — Z8701 Personal history of pneumonia (recurrent): Secondary | ICD-10-CM | POA: Insufficient documentation

## 2024-02-04 DIAGNOSIS — Z79899 Other long term (current) drug therapy: Secondary | ICD-10-CM | POA: Insufficient documentation

## 2024-02-04 DIAGNOSIS — I2510 Atherosclerotic heart disease of native coronary artery without angina pectoris: Secondary | ICD-10-CM | POA: Diagnosis not present

## 2024-02-04 DIAGNOSIS — Z7982 Long term (current) use of aspirin: Secondary | ICD-10-CM | POA: Diagnosis not present

## 2024-02-04 DIAGNOSIS — C50912 Malignant neoplasm of unspecified site of left female breast: Secondary | ICD-10-CM

## 2024-02-04 DIAGNOSIS — E785 Hyperlipidemia, unspecified: Secondary | ICD-10-CM | POA: Insufficient documentation

## 2024-02-04 DIAGNOSIS — Z1732 Human epidermal growth factor receptor 2 negative status: Secondary | ICD-10-CM | POA: Diagnosis not present

## 2024-02-04 DIAGNOSIS — Z85828 Personal history of other malignant neoplasm of skin: Secondary | ICD-10-CM | POA: Insufficient documentation

## 2024-02-04 DIAGNOSIS — Z803 Family history of malignant neoplasm of breast: Secondary | ICD-10-CM | POA: Insufficient documentation

## 2024-02-04 DIAGNOSIS — Z809 Family history of malignant neoplasm, unspecified: Secondary | ICD-10-CM | POA: Insufficient documentation

## 2024-02-04 DIAGNOSIS — K219 Gastro-esophageal reflux disease without esophagitis: Secondary | ICD-10-CM | POA: Diagnosis not present

## 2024-02-04 DIAGNOSIS — Z171 Estrogen receptor negative status [ER-]: Secondary | ICD-10-CM | POA: Insufficient documentation

## 2024-02-04 DIAGNOSIS — C50112 Malignant neoplasm of central portion of left female breast: Secondary | ICD-10-CM | POA: Diagnosis present

## 2024-02-04 DIAGNOSIS — Z1722 Progesterone receptor negative status: Secondary | ICD-10-CM | POA: Insufficient documentation

## 2024-02-04 NOTE — Consult Note (Signed)
 " NEW PATIENT EVALUATION  Name: Caitlin Roy  MRN: [id number]  Date:   02/04/2024     DOB: 12-21-1940   This 83 y.o. 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  REFERRING PHYSICIAN: Auston Reyes BIRCH, MD  CHIEF COMPLAINT:  Chief Complaint  Patient presents with   Invasive breast cancer    DIAGNOSIS: The encounter diagnosis was Invasive ductal carcinoma of left breast (HCC).   PREVIOUS INVESTIGATIONS:  Mammogram and ultrasound reviewed Clinical notes reviewed Pathology reports reviewed  HPI: Patient is an 83 year old female who on regular screening mammogram was noted to have a suspicious 8 mm left breast mass.  There was no suspicious adenopathy on ultrasound in the left axilla.  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  She went on to have a wide local excision with 2 sentinel lymph nodes negative.  Tumor was invasive carcinoma with apocrine differentiation measuring 8 mm.  Margins were clear at 8 mm.  Tumor was overall grade 2 triple negative.  She is seeing medical oncology based on her age and small tumor size will not receive systemic treatment.  She is healing well she specifically denies breast tenderness cough or bone pain.  PLANNED TREATMENT REGIMEN: Accelerated partial breast radiation  PAST MEDICAL HISTORY:  has a past medical history of Actinic keratosis, Anxiety, Arthritis, B12 deficiency, Basal cell carcinoma (07/15/2019), BCC (basal cell carcinoma) (01/18/2021), BCC (basal cell carcinoma) (02/13/2023), BCC (basal cell carcinoma) (02/13/2023), Bilateral carotid artery disease, BPPV (benign paroxysmal positional vertigo), Cerebral microvascular disease, Cervical radiculopathy, Coronary artery disease (06/21/2007), DDD (degenerative disc disease), cervical, Fibrocystic disease of breast, GERD (gastroesophageal reflux disease),  Hiatal hernia, basal cell carcinoma (10/19/2014), basal cell carcinoma (10/19/2014), basal cell carcinoma (07/24/2017), basal cell carcinoma (03/05/2018), Hyperlipidemia, Hypertension, Invasive ductal carcinoma of left breast (HCC) (12/25/2023), Long-term use of aspirin therapy, Pneumonia, Posterior capsular opacification, S/P CABG x 2 (07/10/2007), Squamous cell carcinoma of skin (04/25/2022), Squamous cell carcinoma of skin (08/13/2023), Statin intolerance, and Thrombocytosis.    PAST SURGICAL HISTORY:  Past Surgical History:  Procedure Laterality Date   ABDOMINAL HYSTERECTOMY     BREAST BIOPSY Bilateral 10+ years ago   Negative   BREAST BIOPSY Left 12/25/2023   US  LT BREAST BX W LOC DEV 1ST LESION IMG BX SPEC US  GUIDE 12/25/2023 [facility]   BREAST BIOPSY Left 01/08/2024   US  LT BREAST SAVI/RF TAG 1ST LESION US  GUIDE 01/08/2024 [facility]   BREAST LUMPECTOMY WITH RADIO FREQUENCY LOCALIZER Left 01/11/2024   Procedure: BREAST LUMPECTOMY WITH RADIO FREQUENCY LOCALIZER;  Surgeon: Rodolph Romano, MD;  Location: [facility];  Service: General;  Laterality: Left;   CORONARY ARTERY BYPASS GRAFT N/A 07/10/2007   LEFT HEART CATH AND CORONARY ANGIOGRAPHY Left 06/21/2007   Procedure: LEFT HEART CATH AND CORONARY ANGIOGRAPHY; Location: [facility]; Surgeon: Wolm Rhyme, MD   SENTINEL NODE BIOPSY Left 01/11/2024   Procedure: BIOPSY, LYMPH NODE, SENTINEL;  Surgeon: Rodolph Romano, MD;  Location: [facility];  Service: General;  Laterality: Left;    FAMILY HISTORY: family history includes Breast cancer (age of onset: 56) in her maternal aunt; Melanoma in her brother.  SOCIAL HISTORY:  reports that she has quit smoking. She has never used smokeless tobacco. She reports that she does not drink alcohol and does not use drugs.  ALLERGIES: Statins and Amitriptyline  MEDICATIONS:  Current Outpatient Medications  Medication Sig Dispense Refill   ALPRAZolam (XANAX)  0.5 MG tablet Take 0.25-0.5  mg by mouth 2 (two) times daily as needed for anxiety.     aspirin 81 MG chewable tablet Chew 81 mg by mouth daily.     ezetimibe (ZETIA) 10 MG tablet Take 10 mg by mouth daily.     hydrochlorothiazide (HYDRODIURIL) 25 MG tablet Take 12.5 mg by mouth daily.     traMADol  (ULTRAM ) 50 MG tablet Take 1 tablet (50 mg total) by mouth every 6 (six) hours as needed. (Patient not taking: Reported on 02/04/2024) 10 tablet 0   No current facility-administered medications for this encounter.    ECOG PERFORMANCE STATUS:  0 - Asymptomatic  REVIEW OF SYSTEMS: Patient denies any weight loss, fatigue, weakness, fever, chills or night sweats. Patient denies any loss of vision, blurred vision. Patient denies any ringing  of the ears or hearing loss. No irregular heartbeat. Patient denies heart murmur or history of fainting. Patient denies any chest pain or pain radiating to her upper extremities. Patient denies any shortness of breath, difficulty breathing at night, cough or hemoptysis. Patient denies any swelling in the lower legs. Patient denies any nausea vomiting, vomiting of blood, or coffee ground material in the vomitus. Patient denies any stomach pain. Patient states has had normal bowel movements no significant constipation or diarrhea. Patient denies any dysuria, hematuria or significant nocturia. Patient denies any problems walking, swelling in the joints or loss of balance. Patient denies any skin changes, loss of hair or loss of weight. Patient denies any excessive worrying or anxiety or significant depression. Patient denies any problems with insomnia. Patient denies excessive thirst, polyuria, polydipsia. Patient denies any swollen glands, patient denies easy bruising or easy bleeding. Patient denies any recent infections, allergies or URI. Patient s visual fields have not changed significantly in recent time.   PHYSICAL EXAM: BP 139/77   Pulse 69   Temp (!) 97.5 F (36.4 C)   Resp 12   Wt 143 lb  (64.9 kg)   BMI 26.16 kg/m  Patient is status post wide local excision of the left breast.  Incision is healing well.  No dominant masses noted in either breast.  No axillary or supraclavicular adenopathy is appreciated.  Well-developed well-nourished patient in NAD. HEENT reveals PERLA, EOMI, discs not visualized.  Oral cavity is clear. No oral mucosal lesions are identified. Neck is clear without evidence of cervical or supraclavicular adenopathy. Lungs are clear to A&P. Cardiac examination is essentially unremarkable with regular rate and rhythm without murmur rub or thrill. Abdomen is benign with no organomegaly or masses noted. Motor sensory and DTR levels are equal and symmetric in the upper and lower extremities. Cranial nerves II through XII are grossly intact. Proprioception is intact. No peripheral adenopathy or edema is identified. No motor or sensory levels are noted. Crude visual fields are within normal range.  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  Will plan delivering 35 Gray in 10 fractions using IMRT treatment planning and delivery.  Risks and benefits of treatment including skin reaction possible fatigue possible thickening of the breast were all discussed in detail with the patient.  I personally set up and ordered CT simulation for early next week.  Patient comprehends my treatment plan well.  She  will not be a candidate for endocrine therapy based on the triple negative nature of her disease.  She will follow-up with medical oncology.  I would like to take this opportunity to thank you for allowing me to participate in the care of your patient.SABRA Marcey Penton,  MD         "

## 2024-02-11 ENCOUNTER — Ambulatory Visit

## 2024-02-12 ENCOUNTER — Encounter: Payer: Self-pay | Admitting: *Deleted

## 2024-02-12 ENCOUNTER — Ambulatory Visit
Admission: RE | Admit: 2024-02-12 | Discharge: 2024-02-12 | Disposition: A | Discharge status: Home / Self Care | Source: Ambulatory Visit | Attending: Radiation Oncology | Admitting: Radiation Oncology

## 2024-02-13 ENCOUNTER — Ambulatory Visit

## 2024-02-15 ENCOUNTER — Other Ambulatory Visit: Payer: Self-pay | Admitting: *Deleted

## 2024-02-15 DIAGNOSIS — C50912 Malignant neoplasm of unspecified site of left female breast: Secondary | ICD-10-CM

## 2024-02-18 ENCOUNTER — Inpatient Hospital Stay: Attending: Oncology

## 2024-02-18 ENCOUNTER — Ambulatory Visit

## 2024-02-18 NOTE — Progress Notes (Signed)
 Tumor Board Documentation  Caitlin Roy was presented by Dr. Jacobo at our Tumor Board on 02/18/2024, which included representatives from medical oncology, radiation oncology, radiology, pathology.  Caitlin Roy currently presents as a current patient with history of the following treatments: surgical intervention(s).  Additionally, we reviewed previous medical and familial history, history of present illness, and recent lab results along with all available histopathologic and imaging studies. The tumor board considered available treatment options and made the following recommendations: Adjuvant radiation    The following procedures/referrals were also placed: No orders of the defined types were placed in this encounter.   Clinical Trial Status: not discussed   Staging used: AJCC Staging: T: T1c N: N0        National site-specific guidelines   were discussed with respect to the case.  Tumor board is a meeting of clinicians from various specialty areas who evaluate and discuss patients for whom a multidisciplinary approach is being considered. Final determinations in the plan of care are those of the provider(s). The responsibility for follow up of recommendations given during tumor board is that of the provider.   Todays extended care, comprehensive team conference, Caitlin Roy was not present for the discussion and was not examined.   Multidisciplinary Tumor Board is a multidisciplinary case peer review process.  Decisions discussed in the Multidisciplinary Tumor Board reflect the opinions of the specialists present at the conference without having examined the patient.  Ultimately, treatment and diagnostic decisions rest with the primary provider(s) and the patient.

## 2024-02-19 ENCOUNTER — Ambulatory Visit: Payer: Medicare Other | Admitting: Dermatology

## 2024-02-19 DIAGNOSIS — L570 Actinic keratosis: Secondary | ICD-10-CM

## 2024-02-19 DIAGNOSIS — L729 Follicular cyst of the skin and subcutaneous tissue, unspecified: Secondary | ICD-10-CM

## 2024-02-19 DIAGNOSIS — Z7189 Other specified counseling: Secondary | ICD-10-CM | POA: Diagnosis not present

## 2024-02-19 DIAGNOSIS — C44612 Basal cell carcinoma of skin of right upper limb, including shoulder: Secondary | ICD-10-CM

## 2024-02-19 DIAGNOSIS — W908XXA Exposure to other nonionizing radiation, initial encounter: Secondary | ICD-10-CM

## 2024-02-19 DIAGNOSIS — Z85828 Personal history of other malignant neoplasm of skin: Secondary | ICD-10-CM

## 2024-02-19 DIAGNOSIS — L578 Other skin changes due to chronic exposure to nonionizing radiation: Secondary | ICD-10-CM

## 2024-02-19 DIAGNOSIS — L814 Other melanin hyperpigmentation: Secondary | ICD-10-CM | POA: Diagnosis not present

## 2024-02-19 DIAGNOSIS — L821 Other seborrheic keratosis: Secondary | ICD-10-CM | POA: Diagnosis not present

## 2024-02-19 DIAGNOSIS — L720 Epidermal cyst: Secondary | ICD-10-CM

## 2024-02-19 DIAGNOSIS — Z1283 Encounter for screening for malignant neoplasm of skin: Secondary | ICD-10-CM | POA: Diagnosis not present

## 2024-02-19 DIAGNOSIS — D485 Neoplasm of uncertain behavior of skin: Secondary | ICD-10-CM | POA: Diagnosis not present

## 2024-02-19 DIAGNOSIS — D229 Melanocytic nevi, unspecified: Secondary | ICD-10-CM

## 2024-02-19 DIAGNOSIS — D1801 Hemangioma of skin and subcutaneous tissue: Secondary | ICD-10-CM | POA: Diagnosis not present

## 2024-02-19 NOTE — Patient Instructions (Addendum)

## 2024-02-19 NOTE — Progress Notes (Signed)
 "  Follow-Up Visit   Subjective  Caitlin Roy is a 84 y.o. female who presents for the following: Skin Cancer Screening and Full Body Skin Exam  The patient presents for Total-Body Skin Exam (TBSE) for skin cancer screening and mole check. The patient has spots, moles and lesions to be evaluated, some may be new or changing and the patient may have concern these could be cancer, including right upper arm (bleeds) and scaly spot right cheek.    The following portions of the chart were reviewed this encounter and updated as appropriate: medications, allergies, medical history  Review of Systems:  No other skin or systemic complaints except as noted in HPI or Assessment and Plan.  Objective  Well appearing patient in no apparent distress; mood and affect are within normal limits.  A full examination was performed including scalp, head, eyes, ears, nose, lips, neck, chest, axillae, abdomen, back, buttocks, bilateral upper extremities, bilateral lower extremities, hands, feet, fingers, toes, fingernails, and toenails. All findings within normal limits unless otherwise noted below.   Relevant physical exam findings are noted in the Assessment and Plan.  R hand dorsum, adjacent to SCC scar Keratotic macule.  Right Upper Arm 0.8 cm pink crusted papule   R malar cheek x 1, L ant nasal ala x 1, L post nasal ala x 1, L upper temple x 1, L glabella x 1, L jaw x 1, R nasal ala x 2, nasal dorsum x 1, R forehead x 1, L forehead x 2, upper back x 5, R hand x 1, L hand x 2, L arm x 1, L lower pretibia x 1 (22) Pink scaly macules.   Assessment & Plan   SKIN CANCER SCREENING PERFORMED TODAY.  ACTINIC DAMAGE WITH PRECANCEROUS ACTINIC KERATOSES Counseling for Topical Chemotherapy Management: Patient exhibits: - Severe, confluent actinic changes with pre-cancerous actinic keratoses that is secondary to cumulative UV radiation exposure over time at face, arms - Condition that is severe; chronic,  not at goal. - diffuse scaly erythematous macules and papules with underlying dyspigmentation - Discussed Prescription Field Treatment topical Chemotherapy for Severe, Chronic Confluent Actinic Changes with Pre-Cancerous Actinic Keratoses Field treatment involves treatment of an entire area of skin that has confluent Actinic Changes (Sun/ Ultraviolet light damage) and PreCancerous Actinic Keratoses by method of PhotoDynamic Therapy (PDT) and/or prescription Topical Chemotherapy agents such as 5-fluorouracil, 5-fluorouracil/calcipotriene, and/or imiquimod.  The purpose is to decrease the number of clinically evident and subclinical PreCancerous lesions to prevent progression to development of skin cancer by chemically destroying early precancer changes that may or may not be visible.  It has been shown to reduce the risk of developing skin cancer in the treated area. As a result of treatment, redness, scaling, crusting, and open sores may occur during treatment course. One or more than one of these methods may be used and may have to be used several times to control, suppress and eliminate the PreCancerous changes. Discussed treatment course, expected reaction, and possible side effects. - Recommend daily broad spectrum sunscreen SPF 30+ to sun-exposed areas, reapply every 2 hours as needed.  - Staying in the shade or wearing long sleeves, sun glasses (UVA+UVB protection) and wide brim hats (4-inch brim around the entire circumference of the hat) are also recommended. - Call for new or changing lesions. - Recommend red light PDT with debridement to face.  Patient will schedule.  May consider treating arms after next follow-up.   LENTIGINES, SEBORRHEIC KERATOSES, HEMANGIOMAS - Benign normal  skin lesions - Benign-appearing - Call for any changes  MELANOCYTIC NEVI - Tan-brown and/or pink-flesh-colored symmetric macules and papules - Benign appearing on exam today - Observation - Call clinic for new  or changing moles - Recommend daily use of broad spectrum spf 30+ sunscreen to sun-exposed areas.   HISTORY OF SQUAMOUS CELL CARCINOMA OF THE SKIN - No evidence of recurrence today R hand dorsum - Recommend regular full body skin exams - Recommend daily broad spectrum sunscreen SPF 30+ to sun-exposed areas, reapply every 2 hours as needed.  - Call if any new or changing lesions are noted between office visits  HISTORY OF BASAL CELL CARCINOMA OF THE SKIN - No evidence of recurrence today, multiple sites, see history - Recommend regular full body skin exams - Recommend daily broad spectrum sunscreen SPF 30+ to sun-exposed areas, reapply every 2 hours as needed.  - Call if any new or changing lesions are noted between office visits  EPIDERMAL INCLUSION CYST Exam: pink firm papule at right zygoma  Benign-appearing. Exam most consistent with an epidermal inclusion cyst. Discussed that a cyst is a benign growth that can grow over time and sometimes get irritated or inflamed. Recommend observation if it is not bothersome. Discussed option of surgical excision to remove it if it is growing, symptomatic, or other changes noted. Please call for new or changing lesions so they can be evaluated.   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 They are chronic with expected duration over 1 year. A portion of actinic keratoses will progress to squamous cell carcinoma of the skin. It is not possible to reliably predict which spots will progress to skin cancer and so treatment is recommended to prevent development of skin cancer.  Recommend daily broad spectrum sunscreen SPF 30+ to sun-exposed areas, reapply every 2 hours as needed.  Recommend staying in the shade or wearing long sleeves, sun glasses (UVA+UVB protection) and wide brim hats (4-inch brim around the  entire circumference of the hat). Call for new or changing lesions. - Destruction of lesion - R hand dorsum, adjacent to SCC scar  Destruction method: cryotherapy   Informed consent: discussed and consent obtained   Lesion destroyed using liquid nitrogen: Yes   Region frozen until ice ball extended beyond lesion: Yes   Outcome: patient tolerated procedure well with no complications   Post-procedure details: wound care instructions given   Additional details:  Prior to procedure, discussed risks of blister formation, small wound, skin dyspigmentation, or rare scar following cryotherapy. Recommend Vaseline ointment to treated areas while healing.   NEOPLASM OF UNCERTAIN BEHAVIOR OF SKIN Right Upper Arm - Epidermal / dermal shaving  Lesion diameter (cm):  0.8 Informed consent: discussed and consent obtained   Patient was prepped and draped in usual sterile fashion: Area prepped with alcohol. Anesthesia: the lesion was anesthetized in a standard fashion   Anesthetic:  1% lidocaine  w/ epinephrine  1-100,000 buffered w/ 8.4% NaHCO3 Instrument used: flexible razor blade   Hemostasis achieved with: pressure, aluminum chloride and electrodesiccation   Outcome: patient tolerated procedure well    - Destruction of lesion  Destruction method: electrodesiccation and curettage   Informed consent: discussed and consent obtained   Curettage performed in three different directions: Yes   Electrodesiccation performed over the curetted area: Yes   Final wound size (cm):  1 Hemostasis achieved with:  pressure, aluminum chloride and  electrodesiccation Outcome: patient tolerated procedure well with no complications   Post-procedure details: wound care instructions given   Post-procedure details comment:  Ointment and bandage applied.  Specimen 1 - Surgical pathology Differential Diagnosis: r/o BCC/SCC Check Margins: No EDC today AK (ACTINIC KERATOSIS) (22) R malar cheek x 1, L ant nasal ala x 1,  L post nasal ala x 1, L upper temple x 1, L glabella x 1, L jaw x 1, R nasal ala x 2, nasal dorsum x 1, R forehead x 1, L forehead x 2, upper back x 5, R hand x 1, L hand x 2, L arm x 1, L lower pretibia x 1 (22) Actinic keratoses are precancerous spots that appear secondary to cumulative UV radiation exposure/sun exposure over time. They are chronic with expected duration over 1 year. A portion of actinic keratoses will progress to squamous cell carcinoma of the skin. It is not possible to reliably predict which spots will progress to skin cancer and so treatment is recommended to prevent development of skin cancer.  Recommend daily broad spectrum sunscreen SPF 30+ to sun-exposed areas, reapply every 2 hours as needed.  Recommend staying in the shade or wearing long sleeves, sun glasses (UVA+UVB protection) and wide brim hats (4-inch brim around the entire circumference of the hat). Call for new or changing lesions. - Destruction of lesion - R malar cheek x 1, L ant nasal ala x 1, L post nasal ala x 1, L upper temple x 1, L glabella x 1, L jaw x 1, R nasal ala x 2, nasal dorsum x 1, R forehead x 1, L forehead x 2, upper back x 5, R hand x 1, L hand x 2, L arm x 1, L lower pretibia x 1 (22)  Destruction method: cryotherapy   Informed consent: discussed and consent obtained   Lesion destroyed using liquid nitrogen: Yes   Region frozen until ice ball extended beyond lesion: Yes   Outcome: patient tolerated procedure well with no complications   Post-procedure details: wound care instructions given   Additional details:  Prior to procedure, discussed risks of blister formation, small wound, skin dyspigmentation, or rare scar following cryotherapy. Recommend Vaseline ointment to treated areas while healing.   Return in about 3 months (around 05/19/2024) for f/u bx, AKs. Plan red light with debridement to face in 1-2 mos.  IAndrea Kerns, CMA, am acting as scribe for Rexene Rattler, MD  .   Documentation: I have reviewed the above documentation for accuracy and completeness, and I agree with the above.  Rexene Rattler, MD    "

## 2024-02-21 ENCOUNTER — Ambulatory Visit
Admission: RE | Admit: 2024-02-21 | Discharge: 2024-02-21 | Disposition: A | Source: Ambulatory Visit | Attending: Radiation Oncology | Admitting: Radiation Oncology

## 2024-02-21 ENCOUNTER — Ambulatory Visit

## 2024-02-22 ENCOUNTER — Telehealth: Payer: Self-pay

## 2024-02-22 NOTE — Telephone Encounter (Signed)
 CSW Intern attempted to contact patient by phone to follow up on emotional support. Intern left voicemail with direct contact information and encouraged patient to call back anytime.  Thersia Daring Clinical Social Work Intern [employer]

## 2024-02-25 ENCOUNTER — Ambulatory Visit

## 2024-02-25 ENCOUNTER — Ambulatory Visit
Admission: RE | Admit: 2024-02-25 | Discharge: 2024-02-25 | Disposition: A | Source: Ambulatory Visit | Attending: Radiation Oncology | Admitting: Radiation Oncology

## 2024-02-25 ENCOUNTER — Other Ambulatory Visit: Payer: Self-pay

## 2024-02-25 LAB — RAD ONC ARIA SESSION SUMMARY
Course Elapsed Days: 0
Plan Fractions Treated to Date: 1
Plan Prescribed Dose Per Fraction: 3.5 Gy
Plan Total Fractions Prescribed: 10
Plan Total Prescribed Dose: 35 Gy
Reference Point Dosage Given to Date: 3.5 Gy
Reference Point Session Dosage Given: 3.5 Gy
Session Number: 1

## 2024-02-26 ENCOUNTER — Other Ambulatory Visit: Payer: Self-pay

## 2024-02-26 ENCOUNTER — Ambulatory Visit
Admission: RE | Admit: 2024-02-26 | Discharge: 2024-02-26 | Disposition: A | Source: Ambulatory Visit | Attending: Radiation Oncology | Admitting: Radiation Oncology

## 2024-02-26 LAB — RAD ONC ARIA SESSION SUMMARY
Course Elapsed Days: 1
Plan Fractions Treated to Date: 2
Plan Prescribed Dose Per Fraction: 3.5 Gy
Plan Total Fractions Prescribed: 10
Plan Total Prescribed Dose: 35 Gy
Reference Point Dosage Given to Date: 7 Gy
Reference Point Session Dosage Given: 3.5 Gy
Session Number: 2

## 2024-02-27 ENCOUNTER — Ambulatory Visit
Admission: RE | Admit: 2024-02-27 | Discharge: 2024-02-27 | Disposition: A | Source: Ambulatory Visit | Attending: Radiation Oncology | Admitting: Radiation Oncology

## 2024-02-27 ENCOUNTER — Other Ambulatory Visit: Payer: Self-pay

## 2024-02-27 LAB — RAD ONC ARIA SESSION SUMMARY
Course Elapsed Days: 2
Plan Fractions Treated to Date: 3
Plan Prescribed Dose Per Fraction: 3.5 Gy
Plan Total Fractions Prescribed: 10
Plan Total Prescribed Dose: 35 Gy
Reference Point Dosage Given to Date: 10.5 Gy
Reference Point Session Dosage Given: 3.5 Gy
Session Number: 3

## 2024-02-28 ENCOUNTER — Other Ambulatory Visit: Payer: Self-pay

## 2024-02-28 ENCOUNTER — Ambulatory Visit
Admission: RE | Admit: 2024-02-28 | Discharge: 2024-02-28 | Disposition: A | Discharge status: Home / Self Care | Source: Ambulatory Visit | Attending: Radiation Oncology | Admitting: Radiation Oncology

## 2024-02-28 LAB — RAD ONC ARIA SESSION SUMMARY
Course Elapsed Days: 3
Plan Fractions Treated to Date: 4
Plan Prescribed Dose Per Fraction: 3.5 Gy
Plan Total Fractions Prescribed: 10
Plan Total Prescribed Dose: 35 Gy
Reference Point Dosage Given to Date: 14 Gy
Reference Point Session Dosage Given: 3.5 Gy
Session Number: 4

## 2024-02-29 ENCOUNTER — Ambulatory Visit
Admission: RE | Admit: 2024-02-29 | Discharge: 2024-02-29 | Disposition: A | Source: Ambulatory Visit | Attending: Radiation Oncology | Admitting: Radiation Oncology

## 2024-02-29 ENCOUNTER — Other Ambulatory Visit: Payer: Self-pay

## 2024-02-29 LAB — RAD ONC ARIA SESSION SUMMARY
Course Elapsed Days: 4
Plan Fractions Treated to Date: 5
Plan Prescribed Dose Per Fraction: 3.5 Gy
Plan Total Fractions Prescribed: 10
Plan Total Prescribed Dose: 35 Gy
Reference Point Dosage Given to Date: 17.5 Gy
Reference Point Session Dosage Given: 3.5 Gy
Session Number: 5

## 2024-03-03 ENCOUNTER — Inpatient Hospital Stay

## 2024-03-03 ENCOUNTER — Ambulatory Visit

## 2024-03-04 ENCOUNTER — Encounter: Payer: Self-pay | Admitting: Dermatology

## 2024-03-04 ENCOUNTER — Ambulatory Visit

## 2024-03-04 ENCOUNTER — Ambulatory Visit: Payer: Self-pay | Admitting: Dermatology

## 2024-03-04 NOTE — Telephone Encounter (Signed)
-----   Message from Rexene Rattler, MD sent at 03/04/2024 12:22 PM EST ----- Skin, R upper arm- Basal Cell Carcinoma, nodular and infiltrative patterns  BCC skin cancer- already treated with EDC at time of biopsy  - please call patient

## 2024-03-04 NOTE — Telephone Encounter (Signed)
 Advised patient biopsy of the right upper arm was BCC and was treated with EDC.

## 2024-03-05 ENCOUNTER — Other Ambulatory Visit: Payer: Self-pay

## 2024-03-05 ENCOUNTER — Ambulatory Visit
Admission: RE | Admit: 2024-03-05 | Discharge: 2024-03-05 | Disposition: A | Source: Ambulatory Visit | Attending: Radiation Oncology | Admitting: Radiation Oncology

## 2024-03-05 LAB — RAD ONC ARIA SESSION SUMMARY
Course Elapsed Days: 9
Plan Fractions Treated to Date: 6
Plan Prescribed Dose Per Fraction: 3.5 Gy
Plan Total Fractions Prescribed: 10
Plan Total Prescribed Dose: 35 Gy
Reference Point Dosage Given to Date: 21 Gy
Reference Point Session Dosage Given: 3.5 Gy
Session Number: 6

## 2024-03-06 ENCOUNTER — Other Ambulatory Visit: Payer: Self-pay

## 2024-03-06 ENCOUNTER — Ambulatory Visit
Admission: RE | Admit: 2024-03-06 | Discharge: 2024-03-06 | Disposition: A | Discharge status: Home / Self Care | Source: Ambulatory Visit | Attending: Radiation Oncology | Admitting: Radiation Oncology

## 2024-03-06 LAB — RAD ONC ARIA SESSION SUMMARY
Course Elapsed Days: 10
Plan Fractions Treated to Date: 7
Plan Prescribed Dose Per Fraction: 3.5 Gy
Plan Total Fractions Prescribed: 10
Plan Total Prescribed Dose: 35 Gy
Reference Point Dosage Given to Date: 24.5 Gy
Reference Point Session Dosage Given: 3.5 Gy
Session Number: 7

## 2024-03-07 ENCOUNTER — Ambulatory Visit

## 2024-03-07 ENCOUNTER — Ambulatory Visit
Admission: RE | Admit: 2024-03-07 | Discharge: 2024-03-07 | Disposition: A | Source: Ambulatory Visit | Attending: Radiation Oncology | Admitting: Radiation Oncology

## 2024-03-07 ENCOUNTER — Other Ambulatory Visit: Payer: Self-pay

## 2024-03-07 ENCOUNTER — Inpatient Hospital Stay

## 2024-03-07 DIAGNOSIS — C50912 Malignant neoplasm of unspecified site of left female breast: Secondary | ICD-10-CM

## 2024-03-07 LAB — CBC (CANCER CENTER ONLY)
HCT: 48.8 % — ABNORMAL HIGH (ref 36.0–46.0)
Hemoglobin: 16.2 g/dL — ABNORMAL HIGH (ref 12.0–15.0)
MCH: 30.1 pg (ref 26.0–34.0)
MCHC: 33.2 g/dL (ref 30.0–36.0)
MCV: 90.5 fL (ref 80.0–100.0)
Platelet Count: 827 10*3/uL — ABNORMAL HIGH (ref 150–400)
RBC: 5.39 MIL/uL — ABNORMAL HIGH (ref 3.87–5.11)
RDW: 13.6 % (ref 11.5–15.5)
WBC Count: 8.7 10*3/uL (ref 4.0–10.5)
nRBC: 0 % (ref 0.0–0.2)

## 2024-03-07 LAB — RAD ONC ARIA SESSION SUMMARY
Course Elapsed Days: 11
Plan Fractions Treated to Date: 8
Plan Prescribed Dose Per Fraction: 3.5 Gy
Plan Total Fractions Prescribed: 10
Plan Total Prescribed Dose: 35 Gy
Reference Point Dosage Given to Date: 28 Gy
Reference Point Session Dosage Given: 3.5 Gy
Session Number: 8

## 2024-03-10 ENCOUNTER — Inpatient Hospital Stay: Attending: Oncology | Admitting: Oncology

## 2024-03-10 ENCOUNTER — Ambulatory Visit

## 2024-03-11 ENCOUNTER — Other Ambulatory Visit: Payer: Self-pay

## 2024-03-11 ENCOUNTER — Ambulatory Visit
Admission: RE | Admit: 2024-03-11 | Discharge: 2024-03-11 | Attending: Radiation Oncology | Admitting: Radiation Oncology

## 2024-03-11 ENCOUNTER — Ambulatory Visit

## 2024-03-11 LAB — RAD ONC ARIA SESSION SUMMARY
Course Elapsed Days: 15
Plan Fractions Treated to Date: 9
Plan Prescribed Dose Per Fraction: 3.5 Gy
Plan Total Fractions Prescribed: 10
Plan Total Prescribed Dose: 35 Gy
Reference Point Dosage Given to Date: 31.5 Gy
Reference Point Session Dosage Given: 3.5 Gy
Session Number: 9

## 2024-03-12 ENCOUNTER — Ambulatory Visit
Admission: RE | Admit: 2024-03-12 | Discharge: 2024-03-12 | Attending: Radiation Oncology | Admitting: Radiation Oncology

## 2024-03-12 ENCOUNTER — Other Ambulatory Visit: Payer: Self-pay

## 2024-03-12 ENCOUNTER — Encounter: Payer: Self-pay | Admitting: *Deleted

## 2024-03-12 LAB — RAD ONC ARIA SESSION SUMMARY
Course Elapsed Days: 16
Plan Fractions Treated to Date: 10
Plan Prescribed Dose Per Fraction: 3.5 Gy
Plan Total Fractions Prescribed: 10
Plan Total Prescribed Dose: 35 Gy
Reference Point Dosage Given to Date: 35 Gy
Reference Point Session Dosage Given: 3.5 Gy
Session Number: 10

## 2024-03-13 NOTE — Radiation Completion Notes (Signed)
 Patient Name: Caitlin Roy, Caitlin Roy MRN: [id number] Date of Birth: 1940/04/21 Referring Physician: AUSTON REYES BIRCH, M.D. Date of Service: 2024-03-13 Radiation Oncologist: Marcey Penton, M.D. 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  There was no suspicious adenopathy on ultrasound in the left axilla.  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  She went on to have a wide local excision with 2 sentinel lymph nodes negative.  Tumor was invasive carcinoma with apocrine differentiation measuring 8 mm.  Margins were clear at 8 mm.  Tumor was overall grade 2 triple negative.  She is seeing medical oncology based on her age and small tumor size will not receive systemic treatment.  She is healing well she specifically denies breast tenderness cough or bone pain.      ==========DELIVERED PLANS==========  First Treatment Date: 2024-02-25 Last Treatment Date: 2024-03-12   Plan Name: Breast_L_APBI Site: Breast, Left Technique: IMRT Mode: Photon Dose Per Fraction: 3.5 Gy Prescribed Dose (Delivered / Prescribed): 35 Gy / 35 Gy Prescribed Fxs (Delivered / Prescribed): 10 / 10     ==========ON TREATMENT VISIT DATES========== 2024-02-26, 2024-03-05, 2024-03-11     ==========UPCOMING VISITS========== 05/20/2024 [facility] SKIN CTR OFFICE VISIT Jackquline Sawyer, MD  05/12/2024 [facility] SELMA Georgina Shasta MARLA, RN  04/09/2024 [facility] RAD ONCOLOGY FOLLOW UP 30 Penton Marcey, MD  04/01/2024 [facility] SKIN CTR RETURN VISIT ASC-NURSE  ROOM  04/01/2024 [facility] [place] SKIN [facility] NURSE VISIT ASC-NURSE ROOM        ==========APPENDIX - ON TREATMENT VISIT NOTES==========   See weekly On Treatment Notes in Epic for details in the Media tab (listed as Progress notes on the On Treatment Visit Dates listed above).

## 2024-03-17 ENCOUNTER — Ambulatory Visit

## 2024-04-01 ENCOUNTER — Ambulatory Visit

## 2024-04-01 ENCOUNTER — Encounter

## 2024-04-09 ENCOUNTER — Ambulatory Visit: Admitting: Radiation Oncology

## 2024-05-12 ENCOUNTER — Inpatient Hospital Stay: Admitting: *Deleted

## 2024-05-20 ENCOUNTER — Ambulatory Visit: Admitting: Dermatology
